# Patient Record
Sex: Female | Born: 1935
Health system: Southern US, Community
[De-identification: ages and names within clinical notes are randomized; demographics above are authoritative.]

## PROBLEM LIST (undated history)

## (undated) DIAGNOSIS — T7840XA Allergy, unspecified, initial encounter: Secondary | ICD-10-CM

## (undated) DIAGNOSIS — E079 Disorder of thyroid, unspecified: Secondary | ICD-10-CM

## (undated) DIAGNOSIS — S02609A Fracture of mandible, unspecified, initial encounter for closed fracture: Secondary | ICD-10-CM

## (undated) DIAGNOSIS — I639 Cerebral infarction, unspecified: Secondary | ICD-10-CM

## (undated) DIAGNOSIS — F32A Depression, unspecified: Secondary | ICD-10-CM

## (undated) DIAGNOSIS — K219 Gastro-esophageal reflux disease without esophagitis: Secondary | ICD-10-CM

## (undated) DIAGNOSIS — I10 Essential (primary) hypertension: Secondary | ICD-10-CM

## (undated) DIAGNOSIS — F419 Anxiety disorder, unspecified: Secondary | ICD-10-CM

## (undated) DIAGNOSIS — E785 Hyperlipidemia, unspecified: Secondary | ICD-10-CM

## (undated) DIAGNOSIS — F329 Major depressive disorder, single episode, unspecified: Secondary | ICD-10-CM

## (undated) DIAGNOSIS — K635 Polyp of colon: Secondary | ICD-10-CM

## (undated) DIAGNOSIS — K5790 Diverticulosis of intestine, part unspecified, without perforation or abscess without bleeding: Secondary | ICD-10-CM

## (undated) DIAGNOSIS — K648 Other hemorrhoids: Secondary | ICD-10-CM

## (undated) DIAGNOSIS — L814 Other melanin hyperpigmentation: Secondary | ICD-10-CM

## (undated) HISTORY — DX: Essential (primary) hypertension: I10

## (undated) HISTORY — DX: Fracture of mandible, unspecified, initial encounter for closed fracture: S02.609A

## (undated) HISTORY — DX: Diverticulosis of intestine, part unspecified, without perforation or abscess without bleeding: K57.90

## (undated) HISTORY — DX: Depression, unspecified: F32.A

## (undated) HISTORY — DX: Other hemorrhoids: K64.8

## (undated) HISTORY — DX: Polyp of colon: K63.5

## (undated) HISTORY — DX: Disorder of thyroid, unspecified: E07.9

## (undated) HISTORY — PX: CATARACT EXTRACTION: SUR2

## (undated) HISTORY — DX: Major depressive disorder, single episode, unspecified: F32.9

## (undated) HISTORY — DX: Allergy, unspecified, initial encounter: T78.40XA

## (undated) HISTORY — DX: Hyperlipidemia, unspecified: E78.5

## (undated) HISTORY — DX: Anxiety disorder, unspecified: F41.9

## (undated) HISTORY — PX: TUBAL LIGATION: SHX77

## (undated) HISTORY — PX: CHOLECYSTECTOMY: SHX55

## (undated) HISTORY — PX: TONSILLECTOMY: SHX5217

## (undated) HISTORY — DX: Cerebral infarction, unspecified: I63.9

## (undated) HISTORY — DX: Other melanin hyperpigmentation: L81.4

## (undated) HISTORY — DX: Gastro-esophageal reflux disease without esophagitis: K21.9

---

## 2002-03-02 ENCOUNTER — Other Ambulatory Visit: Admission: RE | Admit: 2002-03-02 | Discharge: 2002-03-02 | Payer: Self-pay | Admitting: Internal Medicine

## 2004-09-06 ENCOUNTER — Ambulatory Visit: Payer: Self-pay | Admitting: Internal Medicine

## 2004-10-21 HISTORY — PX: ENDOSCOPIC VEIN LASER TREATMENT: SHX1508

## 2004-12-10 ENCOUNTER — Ambulatory Visit: Payer: Self-pay | Admitting: Internal Medicine

## 2005-04-29 ENCOUNTER — Ambulatory Visit: Payer: Self-pay | Admitting: Internal Medicine

## 2005-06-10 ENCOUNTER — Ambulatory Visit: Payer: Self-pay | Admitting: Internal Medicine

## 2005-08-21 ENCOUNTER — Ambulatory Visit: Payer: Self-pay | Admitting: Family Medicine

## 2005-12-11 ENCOUNTER — Ambulatory Visit: Payer: Self-pay | Admitting: Internal Medicine

## 2006-03-25 ENCOUNTER — Ambulatory Visit: Payer: Self-pay | Admitting: Family Medicine

## 2006-05-14 ENCOUNTER — Other Ambulatory Visit: Payer: Self-pay

## 2006-05-14 ENCOUNTER — Emergency Department: Payer: Self-pay | Admitting: Emergency Medicine

## 2006-05-16 ENCOUNTER — Ambulatory Visit: Payer: Self-pay | Admitting: Internal Medicine

## 2006-05-19 ENCOUNTER — Ambulatory Visit: Payer: Self-pay | Admitting: Internal Medicine

## 2006-06-03 ENCOUNTER — Ambulatory Visit: Payer: Self-pay | Admitting: Internal Medicine

## 2006-06-12 ENCOUNTER — Ambulatory Visit: Payer: Self-pay | Admitting: Internal Medicine

## 2006-07-29 ENCOUNTER — Ambulatory Visit: Payer: Self-pay | Admitting: Internal Medicine

## 2006-08-19 ENCOUNTER — Ambulatory Visit: Payer: Self-pay | Admitting: Internal Medicine

## 2006-09-02 ENCOUNTER — Ambulatory Visit: Payer: Self-pay | Admitting: Internal Medicine

## 2006-10-07 ENCOUNTER — Ambulatory Visit: Payer: Self-pay | Admitting: Internal Medicine

## 2006-11-03 ENCOUNTER — Ambulatory Visit: Payer: Self-pay | Admitting: Internal Medicine

## 2006-12-15 ENCOUNTER — Ambulatory Visit: Payer: Self-pay | Admitting: Internal Medicine

## 2006-12-15 LAB — CONVERTED CEMR LAB
BUN: 16 mg/dL (ref 6–23)
Chloride: 100 meq/L (ref 96–112)
GFR calc Af Amer: 80 mL/min
GFR calc non Af Amer: 66 mL/min
Glucose, Bld: 106 mg/dL — ABNORMAL HIGH (ref 70–99)
Hgb A1c MFr Bld: 7.1 % — ABNORMAL HIGH (ref 4.6–6.0)
Phosphorus: 4.4 mg/dL (ref 2.3–4.6)
Potassium: 4.3 meq/L (ref 3.5–5.1)

## 2006-12-29 ENCOUNTER — Ambulatory Visit: Payer: Self-pay | Admitting: Internal Medicine

## 2007-01-29 ENCOUNTER — Ambulatory Visit: Payer: Self-pay | Admitting: Internal Medicine

## 2007-01-30 LAB — CONVERTED CEMR LAB
ALT: 16 units/L (ref 0–40)
AST: 26 units/L (ref 0–37)
Albumin: 4 g/dL (ref 3.5–5.2)
Alkaline Phosphatase: 88 units/L (ref 39–117)
Basophils Relative: 0.7 % (ref 0.0–1.0)
Calcium: 9.6 mg/dL (ref 8.4–10.5)
Creatinine, Ser: 1.3 mg/dL — ABNORMAL HIGH (ref 0.4–1.2)
Eosinophils Relative: 3.5 % (ref 0.0–5.0)
GFR calc Af Amer: 52 mL/min
HCT: 40.3 % (ref 36.0–46.0)
Hemoglobin: 13.6 g/dL (ref 12.0–15.0)
Lymphocytes Relative: 29.1 % (ref 12.0–46.0)
MCHC: 33.8 g/dL (ref 30.0–36.0)
RBC: 4.68 M/uL (ref 3.87–5.11)
RDW: 13 % (ref 11.5–14.6)
Total Bilirubin: 0.6 mg/dL (ref 0.3–1.2)
Total Protein: 7.1 g/dL (ref 6.0–8.3)
WBC: 9.6 10*3/uL (ref 4.5–10.5)

## 2007-02-03 DIAGNOSIS — I10 Essential (primary) hypertension: Secondary | ICD-10-CM

## 2007-02-03 DIAGNOSIS — E1140 Type 2 diabetes mellitus with diabetic neuropathy, unspecified: Secondary | ICD-10-CM

## 2007-02-03 DIAGNOSIS — F39 Unspecified mood [affective] disorder: Secondary | ICD-10-CM

## 2007-02-03 DIAGNOSIS — K219 Gastro-esophageal reflux disease without esophagitis: Secondary | ICD-10-CM | POA: Insufficient documentation

## 2007-02-03 DIAGNOSIS — J301 Allergic rhinitis due to pollen: Secondary | ICD-10-CM

## 2007-02-20 ENCOUNTER — Encounter: Payer: Self-pay | Admitting: Internal Medicine

## 2007-03-03 ENCOUNTER — Encounter: Payer: Self-pay | Admitting: Internal Medicine

## 2007-04-08 ENCOUNTER — Telehealth (INDEPENDENT_AMBULATORY_CARE_PROVIDER_SITE_OTHER): Payer: Self-pay | Admitting: *Deleted

## 2007-05-06 ENCOUNTER — Encounter: Payer: Self-pay | Admitting: Internal Medicine

## 2007-05-11 ENCOUNTER — Encounter (INDEPENDENT_AMBULATORY_CARE_PROVIDER_SITE_OTHER): Payer: Self-pay | Admitting: *Deleted

## 2007-05-21 ENCOUNTER — Ambulatory Visit: Payer: Self-pay | Admitting: Family Medicine

## 2007-06-03 ENCOUNTER — Ambulatory Visit: Payer: Self-pay | Admitting: Internal Medicine

## 2007-06-04 LAB — CONVERTED CEMR LAB
Albumin: 3.8 g/dL (ref 3.5–5.2)
BUN: 15 mg/dL (ref 6–23)
Calcium: 9.7 mg/dL (ref 8.4–10.5)
Chloride: 103 meq/L (ref 96–112)
GFR calc Af Amer: 71 mL/min
Glucose, Bld: 104 mg/dL — ABNORMAL HIGH (ref 70–99)
Potassium: 3.3 meq/L — ABNORMAL LOW (ref 3.5–5.1)

## 2007-06-08 ENCOUNTER — Ambulatory Visit: Payer: Self-pay | Admitting: Internal Medicine

## 2007-06-08 ENCOUNTER — Encounter: Payer: Self-pay | Admitting: Internal Medicine

## 2007-06-09 ENCOUNTER — Encounter (INDEPENDENT_AMBULATORY_CARE_PROVIDER_SITE_OTHER): Payer: Self-pay | Admitting: *Deleted

## 2007-07-03 ENCOUNTER — Ambulatory Visit: Payer: Self-pay | Admitting: Internal Medicine

## 2007-07-27 ENCOUNTER — Ambulatory Visit: Payer: Self-pay | Admitting: Internal Medicine

## 2007-07-30 ENCOUNTER — Encounter: Payer: Self-pay | Admitting: Internal Medicine

## 2007-08-15 ENCOUNTER — Emergency Department: Payer: Self-pay | Admitting: Emergency Medicine

## 2007-08-18 ENCOUNTER — Encounter: Payer: Self-pay | Admitting: Internal Medicine

## 2007-08-20 ENCOUNTER — Encounter: Admission: RE | Admit: 2007-08-20 | Discharge: 2007-08-20 | Payer: Self-pay | Admitting: Sports Medicine

## 2007-08-22 HISTORY — PX: MENISCUS REPAIR: SHX5179

## 2007-08-31 ENCOUNTER — Telehealth: Payer: Self-pay | Admitting: Internal Medicine

## 2007-09-01 ENCOUNTER — Encounter: Payer: Self-pay | Admitting: Internal Medicine

## 2007-09-01 ENCOUNTER — Observation Stay (HOSPITAL_COMMUNITY): Admission: AD | Admit: 2007-09-01 | Discharge: 2007-09-02 | Payer: Self-pay | Admitting: Orthopedic Surgery

## 2007-09-04 ENCOUNTER — Telehealth: Payer: Self-pay | Admitting: Internal Medicine

## 2007-10-29 ENCOUNTER — Encounter: Payer: Self-pay | Admitting: Internal Medicine

## 2007-10-30 ENCOUNTER — Encounter: Payer: Self-pay | Admitting: Orthopedic Surgery

## 2007-11-06 ENCOUNTER — Ambulatory Visit: Payer: Self-pay | Admitting: Internal Medicine

## 2007-11-06 LAB — CONVERTED CEMR LAB: Rapid Strep: NEGATIVE

## 2007-11-09 LAB — CONVERTED CEMR LAB
BUN: 19 mg/dL (ref 6–23)
Creatinine, Ser: 0.91 mg/dL (ref 0.40–1.20)
Glucose, Bld: 100 mg/dL — ABNORMAL HIGH (ref 70–99)
Hgb A1c MFr Bld: 6.8 % — ABNORMAL HIGH (ref 4.6–6.1)

## 2007-11-22 ENCOUNTER — Encounter: Payer: Self-pay | Admitting: Orthopedic Surgery

## 2007-12-23 ENCOUNTER — Telehealth (INDEPENDENT_AMBULATORY_CARE_PROVIDER_SITE_OTHER): Payer: Self-pay | Admitting: *Deleted

## 2008-02-19 ENCOUNTER — Ambulatory Visit: Payer: Self-pay | Admitting: Internal Medicine

## 2008-02-19 HISTORY — PX: TOTAL KNEE ARTHROPLASTY: SHX125

## 2008-02-23 ENCOUNTER — Telehealth (INDEPENDENT_AMBULATORY_CARE_PROVIDER_SITE_OTHER): Payer: Self-pay | Admitting: *Deleted

## 2008-03-11 ENCOUNTER — Inpatient Hospital Stay (HOSPITAL_COMMUNITY): Admission: RE | Admit: 2008-03-11 | Discharge: 2008-03-18 | Payer: Self-pay | Admitting: Orthopedic Surgery

## 2008-03-12 ENCOUNTER — Encounter (INDEPENDENT_AMBULATORY_CARE_PROVIDER_SITE_OTHER): Payer: Self-pay | Admitting: Neurology

## 2008-03-13 ENCOUNTER — Ambulatory Visit: Payer: Self-pay | Admitting: Internal Medicine

## 2008-03-14 ENCOUNTER — Other Ambulatory Visit: Payer: Self-pay

## 2008-03-15 ENCOUNTER — Other Ambulatory Visit: Payer: Self-pay | Admitting: Neurology

## 2008-03-15 ENCOUNTER — Ambulatory Visit: Payer: Self-pay | Admitting: Physical Medicine & Rehabilitation

## 2008-03-15 ENCOUNTER — Encounter (INDEPENDENT_AMBULATORY_CARE_PROVIDER_SITE_OTHER): Payer: Self-pay | Admitting: Neurology

## 2008-03-16 ENCOUNTER — Telehealth (INDEPENDENT_AMBULATORY_CARE_PROVIDER_SITE_OTHER): Payer: Self-pay | Admitting: *Deleted

## 2008-03-16 ENCOUNTER — Other Ambulatory Visit: Payer: Self-pay | Admitting: Neurology

## 2008-03-16 ENCOUNTER — Encounter (INDEPENDENT_AMBULATORY_CARE_PROVIDER_SITE_OTHER): Payer: Self-pay | Admitting: Neurology

## 2008-03-17 ENCOUNTER — Encounter: Payer: Self-pay | Admitting: Internal Medicine

## 2008-03-17 ENCOUNTER — Other Ambulatory Visit: Payer: Self-pay | Admitting: Neurology

## 2008-03-18 ENCOUNTER — Ambulatory Visit: Payer: Self-pay | Admitting: Vascular Surgery

## 2008-03-18 ENCOUNTER — Other Ambulatory Visit: Payer: Self-pay | Admitting: Neurology

## 2008-03-18 ENCOUNTER — Encounter (INDEPENDENT_AMBULATORY_CARE_PROVIDER_SITE_OTHER): Payer: Self-pay | Admitting: Orthopedic Surgery

## 2008-03-18 ENCOUNTER — Inpatient Hospital Stay (HOSPITAL_COMMUNITY)
Admission: RE | Admit: 2008-03-18 | Discharge: 2008-03-24 | Payer: Self-pay | Admitting: Physical Medicine & Rehabilitation

## 2008-03-18 ENCOUNTER — Encounter: Payer: Self-pay | Admitting: Internal Medicine

## 2008-04-05 ENCOUNTER — Ambulatory Visit: Payer: Self-pay | Admitting: Internal Medicine

## 2008-04-05 DIAGNOSIS — I6990 Unspecified sequelae of unspecified cerebrovascular disease: Secondary | ICD-10-CM

## 2008-04-05 DIAGNOSIS — Q211 Atrial septal defect: Secondary | ICD-10-CM

## 2008-04-25 ENCOUNTER — Telehealth (INDEPENDENT_AMBULATORY_CARE_PROVIDER_SITE_OTHER): Payer: Self-pay | Admitting: *Deleted

## 2008-05-23 ENCOUNTER — Encounter: Payer: Self-pay | Admitting: Internal Medicine

## 2008-06-09 ENCOUNTER — Ambulatory Visit: Payer: Self-pay | Admitting: Internal Medicine

## 2008-06-15 ENCOUNTER — Ambulatory Visit: Payer: Self-pay | Admitting: Internal Medicine

## 2008-06-16 LAB — CONVERTED CEMR LAB
ALT: 12 units/L (ref 0–35)
AST: 16 units/L (ref 0–37)
Basophils Absolute: 0.1 10*3/uL (ref 0.0–0.1)
Bilirubin, Direct: 0.1 mg/dL (ref 0.0–0.3)
Chloride: 108 meq/L (ref 96–112)
Cholesterol: 249 mg/dL (ref 0–200)
Creatinine, Ser: 1 mg/dL (ref 0.4–1.2)
Direct LDL: 140.6 mg/dL
Glucose, Bld: 142 mg/dL — ABNORMAL HIGH (ref 70–99)
HCT: 40.8 % (ref 36.0–46.0)
HDL: 53.5 mg/dL (ref >39.0)
MCHC: 34.3 g/dL (ref 30.0–36.0)
MCV: 84.1 fL (ref 78.0–100.0)
Neutro Abs: 6 10*3/uL (ref 1.4–7.7)
Neutrophils Relative %: 62.5 % (ref 43.0–77.0)
Phosphorus: 4.5 mg/dL (ref 2.3–4.6)
Platelets: 367 10*3/uL (ref 150–400)
Potassium: 4.5 meq/L (ref 3.5–5.1)
Sodium: 144 meq/L (ref 135–145)
VLDL: 28 mg/dL (ref 0–40)
WBC: 9.6 10*3/uL (ref 4.5–10.5)

## 2008-06-23 ENCOUNTER — Telehealth: Payer: Self-pay | Admitting: Internal Medicine

## 2008-07-21 ENCOUNTER — Ambulatory Visit: Payer: Self-pay | Admitting: Internal Medicine

## 2008-07-29 ENCOUNTER — Encounter: Payer: Self-pay | Admitting: Internal Medicine

## 2008-09-05 ENCOUNTER — Telehealth (INDEPENDENT_AMBULATORY_CARE_PROVIDER_SITE_OTHER): Payer: Self-pay | Admitting: *Deleted

## 2008-09-06 ENCOUNTER — Telehealth: Payer: Self-pay | Admitting: Internal Medicine

## 2008-09-14 ENCOUNTER — Encounter: Payer: Self-pay | Admitting: Internal Medicine

## 2008-09-14 ENCOUNTER — Ambulatory Visit: Payer: Self-pay | Admitting: Cardiology

## 2008-09-14 LAB — CONVERTED CEMR LAB
ALT: 10 units/L (ref 0–35)
Albumin: 4.6 g/dL (ref 3.5–5.2)
Indirect Bilirubin: 0.5 mg/dL (ref 0.0–0.9)

## 2008-09-22 ENCOUNTER — Ambulatory Visit: Payer: Self-pay | Admitting: Internal Medicine

## 2008-10-17 ENCOUNTER — Telehealth: Payer: Self-pay | Admitting: Internal Medicine

## 2008-10-24 ENCOUNTER — Ambulatory Visit: Payer: Self-pay | Admitting: Internal Medicine

## 2008-11-04 ENCOUNTER — Telehealth: Payer: Self-pay | Admitting: Internal Medicine

## 2008-11-30 ENCOUNTER — Telehealth: Payer: Self-pay | Admitting: Internal Medicine

## 2008-12-22 ENCOUNTER — Telehealth: Payer: Self-pay | Admitting: Internal Medicine

## 2008-12-29 ENCOUNTER — Ambulatory Visit: Payer: Self-pay | Admitting: Family Medicine

## 2008-12-29 LAB — CONVERTED CEMR LAB
Bacteria, UA: 0
Glucose, Urine, Semiquant: NEGATIVE
Ketones, urine, test strip: NEGATIVE
Nitrite: NEGATIVE
Specific Gravity, Urine: 1.015

## 2008-12-30 ENCOUNTER — Telehealth: Payer: Self-pay | Admitting: Family Medicine

## 2009-01-09 ENCOUNTER — Encounter: Payer: Self-pay | Admitting: Cardiovascular Disease

## 2009-01-09 ENCOUNTER — Ambulatory Visit: Payer: Self-pay | Admitting: Internal Medicine

## 2009-01-09 LAB — CONVERTED CEMR LAB
ALT: 11 units/L (ref 0–35)
Albumin: 4.4 g/dL (ref 3.5–5.2)
Bilirubin, Direct: 0.1 mg/dL (ref 0.0–0.3)
Indirect Bilirubin: 0.2 mg/dL (ref 0.0–0.9)

## 2009-01-19 ENCOUNTER — Ambulatory Visit: Payer: Self-pay | Admitting: Internal Medicine

## 2009-01-19 DIAGNOSIS — G479 Sleep disorder, unspecified: Secondary | ICD-10-CM | POA: Insufficient documentation

## 2009-01-23 LAB — CONVERTED CEMR LAB
HCT: 45.7 % (ref 36.0–46.0)
Lymphocytes Relative: 40 % (ref 12–46)
Lymphs Abs: 4 10*3/uL (ref 0.7–4.0)
MCHC: 31.7 g/dL (ref 30.0–36.0)
MCV: 88.1 fL (ref 78.0–100.0)
Monocytes Absolute: 0.9 10*3/uL (ref 0.1–1.0)
Monocytes Relative: 9 % (ref 3–12)
Platelets: 370 10*3/uL (ref 150–400)
Potassium: 4.1 meq/L (ref 3.5–5.3)
Sodium: 140 meq/L (ref 135–145)
TSH: 3.809 microintl units/mL (ref 0.350–4.500)

## 2009-02-02 ENCOUNTER — Ambulatory Visit: Payer: Self-pay | Admitting: Internal Medicine

## 2009-02-02 ENCOUNTER — Encounter: Payer: Self-pay | Admitting: Internal Medicine

## 2009-02-03 ENCOUNTER — Encounter: Payer: Self-pay | Admitting: Internal Medicine

## 2009-03-01 ENCOUNTER — Telehealth: Payer: Self-pay | Admitting: Internal Medicine

## 2009-04-11 ENCOUNTER — Telehealth: Payer: Self-pay | Admitting: Cardiovascular Disease

## 2009-04-13 ENCOUNTER — Ambulatory Visit: Payer: Self-pay | Admitting: Internal Medicine

## 2009-05-15 ENCOUNTER — Ambulatory Visit: Payer: Self-pay | Admitting: Family Medicine

## 2009-05-15 LAB — CONVERTED CEMR LAB
Casts: 0 /lpf
Urine crystals, microscopic: 0 /hpf

## 2009-05-16 ENCOUNTER — Encounter: Payer: Self-pay | Admitting: Family Medicine

## 2009-05-19 ENCOUNTER — Telehealth: Payer: Self-pay | Admitting: Family Medicine

## 2009-06-02 ENCOUNTER — Ambulatory Visit: Payer: Self-pay | Admitting: Internal Medicine

## 2009-06-02 ENCOUNTER — Telehealth: Payer: Self-pay | Admitting: Internal Medicine

## 2009-06-02 LAB — CONVERTED CEMR LAB
Bacteria, UA: NEGATIVE
Bilirubin Urine: NEGATIVE
Glucose, Urine, Semiquant: NEGATIVE
Ketones, urine, test strip: NEGATIVE
Protein, U semiquant: NEGATIVE
Urobilinogen, UA: 0.2

## 2009-06-05 LAB — CONVERTED CEMR LAB
ALT: 16 units/L (ref 0–35)
AST: 24 units/L (ref 0–37)
Alkaline Phosphatase: 98 units/L (ref 39–117)
Basophils Absolute: 0 10*3/uL (ref 0.0–0.1)
Basophils Relative: 0 % (ref 0.0–3.0)
CO2: 28 meq/L (ref 19–32)
Calcium: 9.8 mg/dL (ref 8.4–10.5)
Chloride: 101 meq/L (ref 96–112)
Eosinophils Absolute: 0.4 10*3/uL (ref 0.0–0.7)
HCT: 40.9 % (ref 36.0–46.0)
Hgb A1c MFr Bld: 7.4 % — ABNORMAL HIGH (ref 4.6–6.5)
MCV: 88.8 fL (ref 78.0–100.0)
Monocytes Absolute: 0.8 10*3/uL (ref 0.1–1.0)
Monocytes Relative: 9.3 % (ref 3.0–12.0)
Neutro Abs: 4.5 10*3/uL (ref 1.4–7.7)
RDW: 14 % (ref 11.5–14.6)
Sodium: 138 meq/L (ref 135–145)
Total Protein: 7.1 g/dL (ref 6.0–8.3)

## 2009-06-19 ENCOUNTER — Ambulatory Visit: Payer: Self-pay | Admitting: Internal Medicine

## 2009-06-19 DIAGNOSIS — E039 Hypothyroidism, unspecified: Secondary | ICD-10-CM | POA: Insufficient documentation

## 2009-07-26 ENCOUNTER — Encounter: Payer: Self-pay | Admitting: Internal Medicine

## 2009-07-26 ENCOUNTER — Ambulatory Visit: Payer: Self-pay | Admitting: Ophthalmology

## 2009-07-28 ENCOUNTER — Encounter: Payer: Self-pay | Admitting: Internal Medicine

## 2009-07-28 ENCOUNTER — Telehealth: Payer: Self-pay | Admitting: Internal Medicine

## 2009-08-01 ENCOUNTER — Telehealth: Payer: Self-pay | Admitting: Family Medicine

## 2009-08-07 ENCOUNTER — Ambulatory Visit: Payer: Self-pay | Admitting: Ophthalmology

## 2009-09-27 ENCOUNTER — Telehealth: Payer: Self-pay | Admitting: Internal Medicine

## 2009-10-02 ENCOUNTER — Ambulatory Visit: Payer: Self-pay | Admitting: Internal Medicine

## 2009-10-06 LAB — CONVERTED CEMR LAB
ALT: 18 units/L (ref 0–35)
AST: 24 units/L (ref 0–37)
Albumin: 4.2 g/dL (ref 3.5–5.2)
Alkaline Phosphatase: 89 units/L (ref 39–117)
Basophils Absolute: 0 10*3/uL (ref 0.0–0.1)
Basophils Relative: 0.4 % (ref 0.0–3.0)
CO2: 28 meq/L (ref 19–32)
Calcium: 9.8 mg/dL (ref 8.4–10.5)
Eosinophils Relative: 4.3 % (ref 0.0–5.0)
GFR calc non Af Amer: 65.58 mL/min (ref >60)
Glucose, Bld: 111 mg/dL — ABNORMAL HIGH (ref 70–99)
Hemoglobin: 14 g/dL (ref 12.0–15.0)
Neutrophils Relative %: 62.8 % (ref 43.0–77.0)
Phosphorus: 3.6 mg/dL (ref 2.3–4.6)
Platelets: 337 10*3/uL (ref 150.0–400.0)
RBC: 4.74 M/uL (ref 3.87–5.11)
Sodium: 140 meq/L (ref 135–145)
TSH: 2.68 microintl units/mL (ref 0.35–5.50)
Total Protein: 7.2 g/dL (ref 6.0–8.3)

## 2009-11-13 ENCOUNTER — Telehealth: Payer: Self-pay | Admitting: Internal Medicine

## 2009-11-21 ENCOUNTER — Telehealth: Payer: Self-pay | Admitting: Internal Medicine

## 2010-01-09 ENCOUNTER — Telehealth: Payer: Self-pay | Admitting: Internal Medicine

## 2010-01-18 ENCOUNTER — Telehealth: Payer: Self-pay | Admitting: Internal Medicine

## 2010-03-08 ENCOUNTER — Encounter: Payer: Self-pay | Admitting: Internal Medicine

## 2010-03-08 ENCOUNTER — Ambulatory Visit: Payer: Self-pay | Admitting: Internal Medicine

## 2010-03-09 ENCOUNTER — Encounter: Payer: Self-pay | Admitting: Internal Medicine

## 2010-03-14 ENCOUNTER — Ambulatory Visit: Payer: Self-pay | Admitting: Internal Medicine

## 2010-03-14 DIAGNOSIS — E785 Hyperlipidemia, unspecified: Secondary | ICD-10-CM

## 2010-03-16 ENCOUNTER — Encounter (INDEPENDENT_AMBULATORY_CARE_PROVIDER_SITE_OTHER): Payer: Self-pay | Admitting: *Deleted

## 2010-03-16 LAB — CONVERTED CEMR LAB
LDL Cholesterol: 45 mg/dL (ref 0–99)
Total Bilirubin: 0.3 mg/dL (ref 0.3–1.2)

## 2010-03-20 ENCOUNTER — Telehealth: Payer: Self-pay | Admitting: Internal Medicine

## 2010-04-12 ENCOUNTER — Telehealth: Payer: Self-pay | Admitting: Internal Medicine

## 2010-05-18 ENCOUNTER — Telehealth: Payer: Self-pay | Admitting: Internal Medicine

## 2010-06-05 ENCOUNTER — Ambulatory Visit: Payer: Self-pay | Admitting: Internal Medicine

## 2010-06-18 ENCOUNTER — Ambulatory Visit: Payer: Self-pay | Admitting: Internal Medicine

## 2010-07-02 ENCOUNTER — Telehealth (INDEPENDENT_AMBULATORY_CARE_PROVIDER_SITE_OTHER): Payer: Self-pay | Admitting: *Deleted

## 2010-07-03 ENCOUNTER — Encounter: Payer: Self-pay | Admitting: Cardiology

## 2010-07-03 ENCOUNTER — Encounter (HOSPITAL_COMMUNITY): Admission: RE | Admit: 2010-07-03 | Discharge: 2010-08-22 | Payer: Self-pay | Admitting: Internal Medicine

## 2010-07-03 ENCOUNTER — Ambulatory Visit: Payer: Self-pay | Admitting: Cardiology

## 2010-07-03 ENCOUNTER — Ambulatory Visit: Payer: Self-pay

## 2010-07-04 ENCOUNTER — Encounter: Payer: Self-pay | Admitting: Cardiology

## 2010-07-17 ENCOUNTER — Ambulatory Visit: Payer: Self-pay | Admitting: Internal Medicine

## 2010-07-18 ENCOUNTER — Telehealth: Payer: Self-pay | Admitting: Internal Medicine

## 2010-07-18 ENCOUNTER — Encounter: Payer: Self-pay | Admitting: Internal Medicine

## 2010-07-18 ENCOUNTER — Ambulatory Visit (HOSPITAL_BASED_OUTPATIENT_CLINIC_OR_DEPARTMENT_OTHER): Admission: RE | Admit: 2010-07-18 | Discharge: 2010-07-18 | Payer: Self-pay | Admitting: Internal Medicine

## 2010-07-25 ENCOUNTER — Ambulatory Visit: Payer: Self-pay | Admitting: Internal Medicine

## 2010-07-26 ENCOUNTER — Encounter: Payer: Self-pay | Admitting: Internal Medicine

## 2010-07-31 ENCOUNTER — Ambulatory Visit: Payer: Self-pay | Admitting: Pulmonary Disease

## 2010-09-10 ENCOUNTER — Ambulatory Visit: Payer: Self-pay | Admitting: Ophthalmology

## 2010-09-17 ENCOUNTER — Telehealth: Payer: Self-pay | Admitting: Internal Medicine

## 2010-09-17 ENCOUNTER — Ambulatory Visit: Payer: Self-pay | Admitting: Internal Medicine

## 2010-09-18 LAB — CONVERTED CEMR LAB: Hgb A1c MFr Bld: 7.4 % — ABNORMAL HIGH (ref 4.6–6.5)

## 2010-09-19 ENCOUNTER — Telehealth: Payer: Self-pay | Admitting: Internal Medicine

## 2010-11-18 LAB — CONVERTED CEMR LAB
Basophils Absolute: 0.1 10*3/uL (ref 0.0–0.1)
Basophils Absolute: 0.1 10*3/uL (ref 0.0–0.1)
Basophils Relative: 1.2 % — ABNORMAL HIGH (ref 0.0–1.0)
Chloride: 102 meq/L (ref 96–112)
Creatinine, Ser: 0.8 mg/dL (ref 0.4–1.2)
Eosinophils Absolute: 0.3 10*3/uL (ref 0.0–0.7)
Eosinophils Relative: 2.9 % (ref 0.0–5.0)
Glucose, Bld: 128 mg/dL — ABNORMAL HIGH (ref 70–99)
HCT: 40.9 % (ref 36.0–46.0)
Hemoglobin: 13.1 g/dL (ref 12.0–15.0)
Hemoglobin: 13.9 g/dL (ref 12.0–15.0)
Hgb A1c MFr Bld: 7.7 % — ABNORMAL HIGH (ref 4.6–6.0)
Lymphocytes Relative: 33.1 % (ref 12.0–46.0)
Lymphs Abs: 3.1 10*3/uL (ref 0.7–4.0)
MCV: 88.6 fL (ref 78.0–100.0)
Monocytes Relative: 8.3 % (ref 3.0–12.0)
Neutro Abs: 4.6 10*3/uL (ref 1.4–7.7)
Neutro Abs: 5.1 10*3/uL (ref 1.4–7.7)
Neutrophils Relative %: 51.4 % (ref 43.0–77.0)
Platelets: 330 10*3/uL (ref 150.0–400.0)
Potassium: 3.8 meq/L (ref 3.5–5.1)
RBC: 4.62 M/uL (ref 3.87–5.11)
RDW: 13.3 % (ref 11.5–14.6)
RDW: 14.8 % — ABNORMAL HIGH (ref 11.5–14.6)
Sodium: 139 meq/L (ref 135–145)
TSH: 2.02 microintl units/mL (ref 0.35–5.50)
TSH: 4.03 microintl units/mL (ref 0.35–5.50)
WBC: 9 10*3/uL (ref 4.5–10.5)

## 2010-11-20 NOTE — Assessment & Plan Note (Signed)
Summary: Cardiology Nuclear Testing  Nuclear Med Background Indications for Stress Test: Evaluation for Ischemia   History: Echo   Symptoms: Diaphoresis, DOE, Fatigue, Light-Headedness, Palpitations, Rapid HR    Nuclear Pre-Procedure Cardiac Risk Factors: CVA, Family History - CAD, History of Smoking, Hypertension, Lipids, NIDDM Caffeine/Decaff Intake: None NPO After: 11:00 PM Lungs: Clear IV 0.9% NS with Angio Cath: 22g     IV Site: R Hand IV Started by: Irean Hong, RN Chest Size (in) 42     Cup Size C     Height (in): 65 Weight (lb): 214 BMI: 35.74  Nuclear Med Study 1 or 2 day study:  1 day     Stress Test Type:  Lexiscan low level Reading MD:  Marca Ancona, MD     Referring MD:  P.Ross Resting Radionuclide:  Technetium 41m Tetrofosmin     Resting Radionuclide Dose:  11 mCi  Stress Radionuclide:  Technetium 81m Tetrofosmin     Stress Radionuclide Dose:  33 mCi   Stress Protocol      Max HR:  139 bpm     Predicted Max HR:  149 bpm  Max Systolic BP: 192 mm Hg     Percent Max HR:  93.29 %Rate Pressure Product:  03474  Lexiscan: 0.4 mg   Stress Test Technologist:  Irean Hong,  RN     Nuclear Technologist:  Doyne Keel, CNMT  Rest Procedure  Myocardial perfusion imaging was performed at rest 45 minutes following the intravenous administration of Technetium 35m Tetrofosmin.  Stress Procedure  The patient received IV Lexiscan 0.4 mg over 15-seconds with concurrent low level exercise and then  Technetium 72m Tetrofosmin injected at 30-seconds while the patient continued walking one more minute.  There were no significant changes with lexiscan, occ PVC's.  Quantitative spect images were obtained after a 45 minute delay.  QPS Raw Data Images:  Normal; no motion artifact; normal heart/lung ratio. Stress Images:  Normal homogeneous uptake in all areas of the myocardium. Rest Images:  Normal homogeneous uptake in all areas of the myocardium. Subtraction (SDS):  There is  no evidence of scar or ischemia. Transient Ischemic Dilatation:  0.92  (Normal <1.22)  Lung/Heart Ratio:  0.34  (Normal <0.45)  Quantitative Gated Spect Images QGS EDV:  60 ml QGS ESV:  15 ml QGS EF:  76 % QGS cine images:  Normal wall motion.    Overall Impression  Exercise Capacity: Lexiscan with no exercise. BP Response: Hypertensive blood pressure response. Clinical Symptoms: No shortness of breath.  ECG Impression: No significant ST segment change suggestive of ischemia. Overall Impression: Normal stress nuclear study.  Appended Document: Cardiology Nuclear Testing Normal stress myoview.  Appended Document: Cardiology Nuclear Testing Patient aware of results.

## 2010-11-20 NOTE — Progress Notes (Signed)
Summary: refill request for clorazepate  Phone Note Refill Request Message from:  Fax from Pharmacy  Refills Requested: Medication #1:  CLORAZEPATE DIPOTASSIUM 7.5 MG TABS Take 1 tablet by mouth three times a day as needed for anxiety   Last Refilled: 04/18/2010 Faxed request from [facility] [employer] is on your desk.  Initial call taken by: Lowella Petties CMA,  May 18, 2010 9:08 AM  Follow-up for Phone Call        okay #90 x 1 Follow-up by: Cindee Salt MD,  May 18, 2010 1:36 PM  Additional Follow-up for Phone Call Additional follow up Details #1::        Rx called to pharmacy Additional Follow-up by: Janee Morn CMA,  May 18, 2010 2:26 PM    Prescriptions: CLORAZEPATE DIPOTASSIUM 7.5 MG TABS (CLORAZEPATE DIPOTASSIUM) Take 1 tablet by mouth three times a day as needed for anxiety  #90 x 1   Entered by:   Janee Morn CMA   Authorized by:   Cindee Salt MD   Signed by:   Janee Morn CMA on 05/18/2010   Method used:   Telephoned to ...       [id number] ([facility]       [address]       [place], [place]  [address]       Ph: [phone] or [phone]       Fax: [phone]   RxID:   [id number]

## 2010-11-20 NOTE — Miscellaneous (Signed)
Summary: Appointment Canceled  Appointment status changed to canceled by [employer] on 06/26/2010 12:39 PM.  Cancellation Comments --------------------- [facility] [address]EVERCARE/MEDICAID/SL  Appointment Information ----------------------- Appt Type:  CARDIOLOGY NUCLEAR TESTING      Date:  Wednesday, June 27, 2010      Time:  9:30 AM for 15 min   Urgency:  Routine   Made By:  Pearson Grippe  To Visit:  [id number]    Reason:  [facility] [address]EVERCARE/MEDICAID/SL  Appt Comments ------------- -- 06/26/10 12:39: (CEMR) CANCELED -- [facility] [address]EVERCARE/MEDICAID/SL -- 06/22/10 9:58: (CEMR) BOOKED -- Routine CARDIOLOGY NUCLEAR TESTING at 06/27/2010 9:30 AM for 15 min [facility] [address]EVERCARE/MEDICAID/SL -- 06/18/10 13:09: (CEMR) BOOK

## 2010-11-20 NOTE — Progress Notes (Signed)
Summary: needs scripts faxed  Phone Note Call from Patient   Caller: Patient Call For: Cindee Salt MD Summary of Call: Pt was seen on monday and was told scripts would be sent to [facility] electronically.  Pharmacy told her they didnt get these scripts but also told her that because she is medicare and the scripts are new ones they have to have written scripts faxed to them with dx and number of times to test daily.  Fax is [phone]. Initial call taken by: Lowella Petties CMA, AAMA,  September 19, 2010 8:36 AM  Follow-up for Phone Call        new scripts faxed to [facility], Spoke with patient and advised results.  Follow-up by: Mervin Hack CMA Duncan Dull),  September 19, 2010 10:10 AM    Prescriptions: BLOOD GLUCOSE TEST  STRP (GLUCOSE BLOOD) patient tests once daily dx: 250.00  #79mth supply x 12   Entered by:   Mervin Hack CMA (AAMA)   Authorized by:   Cindee Salt MD   Signed by:   Mervin Hack CMA (AAMA) on 09/19/2010   Method used:   Printed then faxed to ...       [facility]* (retail)       [address] [place], [place]  [address]       Ph: [phone]       Fax: [phone]   RxID:   [id number] BLOOD GLUCOSE METER  KIT (BLOOD GLUCOSE MONITORING SUPPL) patient tests once daily dx:250.00  #1 x 0   Entered by:   Mervin Hack CMA (AAMA)   Authorized by:   Cindee Salt MD   Signed by:   Mervin Hack CMA (AAMA) on 09/19/2010   Method used:   Printed then faxed to ...       [facility]* (retail)       [address] [place], [place]  [address]       Ph: [phone]       Fax: [phone]   RxID:   [id number]   Appended Document: needs scripts faxed Pt says we had the wrong pharmacy name, [facility], scripts faxed.

## 2010-11-20 NOTE — Assessment & Plan Note (Signed)
Summary: COUGH,CONGESTION,WATERY EYES/CLE   Vital Signs:  Patient profile:   75 year old female Weight:      212 pounds O2 Sat:      96 % on Room air Temp:     97.8 degrees F tympanic Pulse rate:   100 / minute Pulse rhythm:   regular BP sitting:   122 / 88  (left arm) Cuff size:   large  Vitals Entered By: Mervin Hack CMA Duncan Dull) (June 05, 2010 12:37 PM)  O2 Flow:  Room air CC: cough, allergies   History of Present Illness: Having respiratory illness since last week Lots of sneezing and coughing Throat is sore and she is hoarse Cough is productive of grayish sputum---seems to be coming from PND Some ear pain  No clear cut fever some sweats  no chills or shakes  Took allegra and benzonatate Astelin also  Allergies: 1)  ! Wellbutrin (Bupropion Hcl) 2)  Codeine Phosphate (Codeine Phosphate) 3)  * Xanax 4)  * Serzone 5)  Celexa 6)  Prozac (Fluoxetine Hcl) 7)  Norvasc (Amlodipine Besylate) 8)  * Ace  Past History:  Past medical, surgical, family and social histories (including risk factors) reviewed for relevance to current acute and chronic problems.  Past Medical History: Reviewed history from 03/14/2010 and no changes required. Allergic rhinitis Anxiety Depression Diabetes mellitus, type II GERD Hypertension CVA--5/09 Hypothyroidism Hyperlipidemia  Past Surgical History: Reviewed history from 10/02/2009 and no changes required. Cholecystectomy--1950's Tonsillectomy VD x5 Tubal ligation in 1960's Vein treatment--laser/injections    2006 Rgiht knee meniscus repair-Dr Ranell Patrick  11/08 TKR 5/09--CVA after Ranell Patrick) Cataract left eye----------Dr Dingledein  Family History: Reviewed history from 02/03/2007 and no changes required. Mom had CAD and vascular disease  Social History: Reviewed history from 11/06/2007 and no changes required. 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  NAD Head:  no frontal tenderness Moderate right maxillary tenderness, mild on left Ears:  R ear normal and L ear normal.   Nose:  moderate congestion Thick, opaque discharge Mouth:  no erythema, no exudates, and no lesions.   Neck:  supple, no masses, and no cervical lymphadenopathy.   Lungs:  normal respiratory effort, no intercostal retractions, no accessory muscle use, normal breath sounds, no crackles, and no wheezes.     Impression & Recommendations:  Problem # 1:  SINUSITIS - ACUTE-NOS (ICD-461.9) Assessment New  not clear if she has viral or early bacterial infeciton discussed supportive care if worsens, will start amoxil  Her updated medication list for this problem includes:    Amoxicillin 500 Mg Tabs (Amoxicillin) .Marland Kitchen... 2 tabs by mouth two times a day for sinus infection  Complete Medication List: 1)  Zocor 80 Mg Tabs (Simvastatin) .Marland Kitchen.. 1  tab by mouth at bedtime 2)  Levothyroxine Sodium 25 Mcg Tabs (Levothyroxine sodium) .... Take 1 by mouth once daily 3)  Clorazepate Dipotassium 7.5 Mg Tabs (Clorazepate dipotassium) .... Take 1 tablet by mouth three times a day as needed for anxiety 4)  Potassium Chloride Crys Cr 20 Meq Tbcr (Potassium chloride crys cr) .... Take 2 tablets by mouth daily 5)  Triamterene-hctz 37.5-25 Mg Tabs (Triamterene-hctz) .... Take 1 tablet by mouth once a day 6)  Metformin Hcl 500 Mg Tabs (Metformin  hcl) .... Once tab two times a day 7)  Aspirin 325 Mg Tabs (Aspirin) .... Take one tablet by mouth daily 8)  Allegra 180 Mg Tabs (Fexofenadine hcl) .... Take one by mouth daily as needed 9)  Flexeril 5 Mg Tabs (Cyclobenzaprine hcl) .... Take 1 tab as needed muscle spasm 10)  Vitamin B-12 500 Mcg Tabs (Cyanocobalamin) .... Take 1 by mouth once daily 11)  Daily Vitamins Tabs  (Multiple vitamin) .... Vitamin d-3 take 1 by mouth once daily 12)  Amoxicillin 500 Mg Tabs (Amoxicillin) .... 2 tabs by mouth two times a day for sinus infection  Patient Instructions: 1)  Please schedule a follow-up appointment as needed .  2)  Please keep regular follow up Prescriptions: AMOXICILLIN 500 MG TABS (AMOXICILLIN) 2 tabs by mouth two times a day for sinus infection  #40 x 0   Entered and Authorized by:   Cindee Salt MD   Signed by:   Cindee Salt MD on 06/05/2010   Method used:   Print then Give to Patient   RxID:   [id number]   Current Allergies (reviewed today): ! WELLBUTRIN (BUPROPION HCL) CODEINE PHOSPHATE (CODEINE PHOSPHATE) * XANAX * SERZONE CELEXA PROZAC (FLUOXETINE HCL) NORVASC (AMLODIPINE BESYLATE) * ACE

## 2010-11-20 NOTE — Assessment & Plan Note (Signed)
Summary: 8 M F/U DLO   Vital Signs:  Patient profile:   75 year old female Weight:      212 pounds BMI:     35.41 Temp:     97.3 degrees F oral Pulse rate:   64 / minute Pulse rhythm:   regular BP sitting:   120 / 80  (left arm) Cuff size:   large  Vitals Entered By: Mervin Hack CMA Duncan Dull) (Mar 14, 2010 11:04 AM) CC: 6 month follow-up   History of Present Illness: Doing fairly well  Still has trouble using left hand properly Right hand is her dominant hand but is affected just buttoning clothes, etc  Still mows yard keeps busy with various things sits with woman once a week Hopes to get back to working again but not sure what  has episodic depression 4 deaths in past month or so--this has been hard got bad GI bug when went to beach for wedding has chills at night about once a month--may be related to overexerting when she drives herself to beach  Sugars have been very variable as high as 220 but not fasting doesn't check fasting 1-2 weekly she checks Occ mild hypoglycemic reactions---vague report though  No chest pain No SOB Does try to walk regularly  Allergies: 1)  ! Wellbutrin (Bupropion Hcl) 2)  Codeine Phosphate (Codeine Phosphate) 3)  * Xanax 4)  * Serzone 5)  Celexa 6)  Prozac (Fluoxetine Hcl) 7)  Norvasc (Amlodipine Besylate) 8)  * Ace  Past History:  Past medical, surgical, family and social histories (including risk factors) reviewed for relevance to current acute and chronic problems.  Past Medical History: Allergic rhinitis Anxiety Depression Diabetes mellitus, type II GERD Hypertension CVA--5/09 Hypothyroidism Hyperlipidemia  Past Surgical History: Reviewed history from 10/02/2009 and no changes required. Cholecystectomy--1950's Tonsillectomy VD x5 Tubal ligation in 1960's Vein treatment--laser/injections    2006 Rgiht knee meniscus repair-Dr Ranell Patrick  11/08 TKR 5/09--CVA after Ranell Patrick) Cataract left eye----------Dr  Dingledein  Family History: Reviewed history from 02/03/2007 and no changes required. Mom had CAD and vascular disease  Social History: Reviewed history from 11/06/2007 and no changes required. 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   Neck:  supple, no masses, no thyromegaly, no carotid bruits, and no cervical lymphadenopathy.   Lungs:  normal respiratory effort and normal breath sounds.   Heart:  normal rate, regular rhythm, no murmur, and no gallop.   Abdomen:  soft and non-tender.   Pulses:  1+ on right, faint on left Extremities:  no edema Skin:  no suspicious lesions and no ulcerations.   Psych:  normally interactive, good eye contact, not anxious appearing, and dysphoric affect.    Diabetes Management Exam:    Foot Exam (with socks and/or shoes not present):       Sensory-Pinprick/Light touch:          Left medial foot (L-4): diminished          Left dorsal foot (L-5): diminished          Left lateral foot (S-1): diminished          Right medial foot (L-4): normal  Right dorsal foot (L-5): normal          Right lateral foot (S-1): normal       Inspection:          Left foot: normal          Right foot: normal       Nails:          Left foot: normal          Right foot: normal   Impression & Recommendations:  Problem # 1:  DIABETES MELLITUS, TYPE II (ICD-250.00) Assessment Unchanged  still seems to have good control will check labs  Her updated medication list for this problem includes:    Metformin Hcl 500 Mg Tabs (Metformin hcl) ..... Once tab two times a day    Aspirin 325 Mg Tabs (Aspirin) .Marland Kitchen... Take one tablet by mouth daily  Labs Reviewed: Creat:  0.9 (10/02/2009)     Last Eye Exam: no retinopathy (08/07/2009) Reviewed HgBA1c results: 7.4 (10/02/2009)  7.4 (06/02/2009)  Orders: TLB-A1C / Hgb A1C (Glycohemoglobin) (83036-A1C) Venipuncture ([address])  Problem # 2:  CEREBROVASCULAR ACCIDENT, LATE EFFECTS (ICD-438.9) Assessment: Unchanged still with left hand problems on statin also  Her updated medication list for this problem includes:    Aspirin 325 Mg Tabs (Aspirin) .Marland Kitchen... Take one tablet by mouth daily  Problem # 3:  HYPERTENSION (ICD-401.9) Assessment: Unchanged good control no changes needed  Her updated medication list for this problem includes:    Triamterene-hctz 37.5-25 Mg Tabs (Triamterene-hctz) .Marland Kitchen... Take 1 tablet by mouth once a day  BP today: 120/80 Prior BP: 140/80 (10/02/2009)  Labs Reviewed: K+: 4.2 (10/02/2009) Creat: : 0.9 (10/02/2009)   Chol: 249 (06/15/2008)   HDL: 53.5 (06/15/2008)   LDL: DEL (06/15/2008)   TG: 139 (06/15/2008)  Problem # 4:  DEPRESSION (ICD-311) Assessment: Deteriorated mild worsening with all the deaths in her circle of acquaintances  The following medications were removed from the medication list:    Trazodone Hcl 50 Mg Tabs (Trazodone hcl) .Marland Kitchen... 1-2 at bedtime to help sleep Her updated medication list for this problem includes:    Clorazepate Dipotassium 7.5 Mg Tabs (Clorazepate dipotassium) .Marland Kitchen... Take 1 tablet by mouth three times a day  Problem # 5:  HYPOTHYROIDISM (ICD-244.9) Assessment: Unchanged no changes needed  Her updated medication list for this problem includes:    Levothyroxine Sodium 25 Mcg Tabs (Levothyroxine sodium) .Marland Kitchen... Take 1 by mouth once daily  Labs Reviewed: TSH: 2.68 (10/02/2009)    HgBA1c: 7.4 (10/02/2009) Chol: 249 (06/15/2008)   HDL: 53.5 (06/15/2008)   LDL: DEL (06/15/2008)   TG: 139 (06/15/2008)  Problem # 6:  GERD (ICD-530.81) Assessment: Unchanged doing okay  Her updated medication list for this problem includes:    Prilosec Otc 20 Mg  Tbec (Omeprazole magnesium) .Marland Kitchen... Take 1 tablet by mouth once daily  Complete Medication List: 1)  Zocor 80 Mg Tabs (Simvastatin) .... Take on tab by mouth at bedtime 2)  Levothyroxine Sodium 25 Mcg Tabs (Levothyroxine sodium) .... Take 1 by mouth once daily 3)  Clorazepate Dipotassium 7.5 Mg Tabs (Clorazepate dipotassium) .... Take 1 tablet by mouth three times a day 4)  Potassium Chloride Crys Cr 20 Meq Tbcr (Potassium chloride crys cr) .... Take 2 tablets by mouth daily 5)  Triamterene-hctz 37.5-25 Mg Tabs (Triamterene-hctz) .... Take 1 tablet by mouth once a day 6)  Metformin Hcl 500 Mg Tabs (Metformin hcl) .... Once tab two times a day 7)  Prilosec Otc 20 Mg  Tbec (Omeprazole magnesium) .... Take 1 tablet by mouth once daily 8)  Aspirin 325 Mg Tabs (Aspirin) .... Take one tablet by mouth daily 9)  Allegra 180 Mg Tabs (Fexofenadine hcl) .... Take one by mouth daily as needed 10)  Flexeril 5 Mg Tabs (Cyclobenzaprine hcl) .... Take 1 tab as needed muscle spasm 11)  Vitamin B-12 500 Mcg Tabs (Cyanocobalamin) .... Take 1 by mouth once daily 12)  Daily Vitamins Tabs (Multiple vitamin) .... Vitamin d-3 take 1 by mouth once daily  Other Orders: TLB-Lipid Panel (80061-LIPID) TLB-Hepatic/Liver Function Pnl (80076-HEPATIC)  Patient Instructions: 1)  Please schedule a follow-up appointment in 6 months .   Current Allergies (reviewed today): ! WELLBUTRIN (BUPROPION HCL) CODEINE PHOSPHATE (CODEINE PHOSPHATE) * XANAX * SERZONE CELEXA PROZAC (FLUOXETINE HCL) NORVASC (AMLODIPINE BESYLATE) * ACE  Appended Document: Orders Update    Clinical Lists Changes  Problems: Added new problem of SCREENING, COLON CANCER (ICD-V76.51) Orders: Added new Referral order of Gastroenterology Referral (GI) - Signed

## 2010-11-20 NOTE — Letter (Signed)
Summary: [facility] Instructions  [facility]  [address] [place], [place] [address]   Phone: [phone]  Fax: [phone]       KIRIN BRANDENBURGER    Nov 26, 1935    MRN: [id number]        Procedure Day Dorna Bloom: Wednesday October 5th, 2011     Arrival Time: 10:30am     Procedure Time: 11:30am     Location of Procedure:                    _x _  [facility] (4th Floor)                        PREPARATION FOR COLONOSCOPY WITH MOVIPREP   Starting 5 days prior to your procedure 07/20/10 do not eat nuts, seeds, popcorn, corn, beans, peas,  salads, or any raw vegetables.  Do not take any fiber supplements (e.g. Metamucil, Citrucel, and Benefiber).  THE DAY BEFORE YOUR PROCEDURE         DATE: 07/24/10  DAY: Tuesday  1.  Drink clear liquids the entire day-NO SOLID FOOD  2.  Do not drink anything colored red or purple.  Avoid juices with pulp.  No orange juice.  3.  Drink at least 64 oz. (8 glasses) of fluid/clear liquids during the day to prevent dehydration and help the prep work efficiently.  CLEAR LIQUIDS INCLUDE: Water Jello Ice Popsicles Tea (sugar ok, no milk/cream) Powdered fruit flavored drinks Coffee (sugar ok, no milk/cream) Gatorade Juice: apple, white grape, white cranberry  Lemonade Clear bullion, consomm, broth Carbonated beverages (any kind) Strained chicken noodle soup Hard Candy                             4.  In the morning, mix first dose of MoviPrep solution:    Empty 1 Pouch A and 1 Pouch B into the disposable container    Add lukewarm drinking water to the top line of the container. Mix to dissolve    Refrigerate (mixed solution should be used within 24 hrs)  5.  Begin drinking the prep at 5:00 p.m. The MoviPrep container is divided by 4 marks.   Every 15 minutes drink the solution down to the next mark (approximately 8 oz) until the full liter is complete.   6.  Follow completed prep with 16 oz of clear liquid of your  choice (Nothing red or purple).  Continue to drink clear liquids until bedtime.  7.  Before going to bed, mix second dose of MoviPrep solution:    Empty 1 Pouch A and 1 Pouch B into the disposable container    Add lukewarm drinking water to the top line of the container. Mix to dissolve    Refrigerate  THE DAY OF YOUR PROCEDURE      DATE: 07/25/10 DAY: Wednesday  Beginning at 6:30 a.m. (5 hours before procedure):         1. Every 15 minutes, drink the solution down to the next mark (approx 8 oz) until the full liter is complete.  2. Follow completed prep with 16 oz. of clear liquid of your choice.    3. You may drink clear liquids until 9:30am (2 HOURS BEFORE PROCEDURE).   MEDICATION INSTRUCTIONS  Unless otherwise instructed, you should take regular prescription medications with a small sip of water   as early as possible the morning of your  procedure.  Diabetic patients - see separate instructions.        OTHER INSTRUCTIONS  You will need a responsible adult at least 73 years of age to accompany you and drive you home.   This person must remain in the waiting room during your procedure.  Wear loose fitting clothing that is easily removed.  Leave jewelry and other valuables at home.  However, you may wish to bring a book to read or  an iPod/MP3 player to listen to music as you wait for your procedure to start.  Remove all body piercing jewelry and leave at home.  Total time from sign-in until discharge is approximately 2-3 hours.  You should go home directly after your procedure and rest.  You can resume normal activities the  day after your procedure.  The day of your procedure you should not:   Drive   Make legal decisions   Operate machinery   Drink alcohol   Return to work  You will receive specific instructions about eating, activities and medications before you leave.    The above instructions have been reviewed and explained to me by    _______________________    I fully understand and can verbalize these instructions _____________________________ Date _________

## 2010-11-20 NOTE — Progress Notes (Signed)
Summary: Rx Clorazepate  Phone Note [facility]ill Request [facility] on July 18, 2010 2:29 PM  Refills Requested: Medication #1:  CLORAZEPATE DIPOTASSIUM 7.5 MG TABS Take 1 tablet by mouth three times a day as needed for anxiety   Last Refilled: 12/18/2009  Method Requested: Telephone to Pharmacy Initial call taken by: Sydell Axon LPN,  July 18, 2010 2:29 PM  Follow-up for Phone Call        okay #90 x 1 Follow-up by: Cindee Salt MD,  July 19, 2010 1:18 PM  Additional Follow-up for Phone Call Additional follow up Details #1::        Rx called to pharmacy Additional Follow-up by: Linde Gillis CMA Duncan Dull),  July 19, 2010 5:04 PM    Prescriptions: CLORAZEPATE DIPOTASSIUM 7.5 MG TABS (CLORAZEPATE DIPOTASSIUM) Take 1 tablet by mouth three times a day as needed for anxiety  #90 x 1   Entered by:   Linde Gillis CMA (AAMA)   Authorized by:   Cindee Salt MD   Signed by:   Linde Gillis CMA (AAMA) on 07/19/2010   Method used:   Telephoned to ...       [id number]       [address]       [place], [place]  [address]       Ph: [phone] or [phone]       Fax: [phone]   RxID:   [id number]

## 2010-11-20 NOTE — Assessment & Plan Note (Signed)
Summary: GERD, gas,  screening colonoscopy   History of Present Illness Visit Type: consult Primary GI MD: Yancey Flemings MD Primary Provider: Cindee Salt MD Requesting Provider: Tillman Abide, MD Chief Complaint: problems with gas, GERD, and need for colonoscopy History of Present Illness:   75 year old female with hypertension, hyperlipidemia, hypothyroidism, type 2 diabetes mellitus, anxiety/depression, prior CVA, and GERD. She presents today regarding several issues. First, she reports a greater than 10 year history of problems with GERD. PPI therapy helps, but she does have occasional breakthrough. Complains about cost of drug and is currently on no acid suppressive therapy with significant symptoms. No dysphagia. No prior screening endoscopy. Next, she reports problems with increased intestinal gas and bloating. She has taken probiotic previously, which has helped, but states cannot afford this. Finally, she has been encouraged on a number of occasions to undergo screening colonoscopy. At the urging of her primary care physician, she wishes to discuss this further and possibly set up the examination. Her other chronic medical problems are stable. Medications are stable.Marland Kitchen   GI Review of Systems    Reports acid reflux, bloating, and  weight gain.      Denies abdominal pain, belching, chest pain, dysphagia with liquids, dysphagia with solids, heartburn, loss of appetite, nausea, vomiting, vomiting blood, and  weight loss.      Reports change in bowel habits and  hemorrhoids.     Denies anal fissure, black tarry stools, constipation, diarrhea, diverticulosis, fecal incontinence, heme positive stool, irritable bowel syndrome, jaundice, light color stool, liver problems, rectal bleeding, and  rectal pain. Preventive Screening-Counseling & Management      Drug Use:  no.      Current Medications (verified): 1)  Levothyroxine Sodium 25 Mcg Tabs (Levothyroxine Sodium) .... Take 1 By Mouth  Once Daily 2)  Clorazepate Dipotassium 7.5 Mg Tabs (Clorazepate Dipotassium) .... Take 1 Tablet By Mouth Three Times A Day As Needed For Anxiety 3)  Potassium Chloride Crys Cr 20 Meq Tbcr (Potassium Chloride Crys Cr) .... Take 2 Tablets By Mouth Daily 4)  Triamterene-Hctz 37.5-25 Mg Tabs (Triamterene-Hctz) .... Take 1 Tablet By Mouth Once A Day 5)  Metformin Hcl 500 Mg  Tabs (Metformin Hcl) .... Once Tab Two Times A Day 6)  Aspirin 325 Mg Tabs (Aspirin) .... Take One Tablet By Mouth Daily 7)  Allegra 180 Mg  Tabs (Fexofenadine Hcl) .... Take One By Mouth Daily As Needed 8)  Flexeril 5 Mg Tabs (Cyclobenzaprine Hcl) .... Take 1 Tab As Needed Muscle Spasm 9)  Vitamin B-12 1000 Mcg Tabs (Cyanocobalamin) .... Take 1 Tablet By Mouth Once A Day 10)  Daily Vitamins  Tabs (Multiple Vitamin) .... Vitamin D-3 Take 1 By Mouth Once Daily 11)  Nasonex 50 Mcg/act Susp (Mometasone Furoate) .... Use 2 Sprays Daily As Needed 12)  Vitamin E 200 Unit Caps (Vitamin E) .... Take 1 Capsule By Mouth Once A Day  Allergies (verified): 1)  ! Wellbutrin (Bupropion Hcl) 2)  Codeine Phosphate (Codeine Phosphate) 3)  * Xanax 4)  * Serzone 5)  Celexa 6)  Prozac (Fluoxetine Hcl) 7)  Norvasc (Amlodipine Besylate) 8)  * Ace  Past History:  Past Medical History: Reviewed history from 03/14/2010 and no changes required. Allergic rhinitis Anxiety Depression Diabetes mellitus, type II GERD Hypertension CVA--5/09 Hypothyroidism Hyperlipidemia  Past Surgical History: Reviewed history from 10/02/2009 and no changes required. Cholecystectomy--1950's Tonsillectomy VD x5 Tubal ligation in 1960's Vein treatment--laser/injections    2006 Rgiht knee meniscus repair-Dr Ranell Patrick  11/08 TKR 5/09--CVA after Ranell Patrick) Cataract left eye----------Dr Dingledein  Family History: Mom had CAD and vascular disease Family History of Breast Cancer: Sister, Mother, MGM, Mat Aunts, Mat Cousins Family History of Ovarian Cancer:  Mother Family History of Diabetes: ? 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  The patient denies anemia, arthritis/joint pain, back pain, blood in urine, breast changes/lumps, confusion, coughing up blood, fainting, fatigue, fever, headaches-new, hearing problems, heart murmur, heart rhythm changes, itching, menstrual pain, night sweats, nosebleeds, pregnancy symptoms, shortness of breath, skin rash, sore throat, swelling of feet/legs, swollen lymph glands, thirst - excessive, urination - excessive, urination changes/pain, and voice change.    Vital Signs:  Patient profile:   75 year old female Height:      65 inches Weight:      216 pounds BMI:     36.07 Pulse rate:   88 / minute Pulse rhythm:   regular BP sitting:   120 / 74  (left arm) Cuff size:   large  Vitals Entered By: Francee Piccolo CMA Duncan Dull) (July 17, 2010 1:33 PM)  Physical Exam  General:  Well developed, obese, well nourished, no acute distress. Head:  Normocephalic and atraumatic. Eyes:  PERRLA, no icterus. Mouth:  No deformity or lesions. Neck:  Supple; no masses or thyromegaly. Lungs:  Clear throughout to auscultation. Heart:  Regular rate and rhythm; no murmurs, rubs,  or bruits. Abdomen:  Soft, obese,nontender and nondistended. No masses, hepatosplenomegaly or hernias noted. Normal bowel sounds. Rectal:  deferred until colonoscopy Msk:  Symmetrical with no gross deformities. Normal posture. Pulses:  Normal pulses noted. Extremities:  No clubbing, cyanosis, edema or deformities  noted. Neurologic:  Alert and  oriented x4;  grossly normal neurologically. Skin:  Intact without significant lesions or rashes. Psych:  Alert and cooperative. Normal mood and affect.   Impression & Recommendations:  Problem # 1:  GERD (ICD-530.81) greater than 10 year history of GERD and an obese Caucasian smoker. Requires PPI for control.  Plan: #1. Reflux precautions #2. Omeprazole 40 mg daily prescribed #3. Consider screening endoscopy after patient has been on PPI for 8 weeks  Problem # 2:  FLATULENCE ERUCTATION AND GAS PAIN (ICD-787.3) chronic intermittent problems with increased intestinal gas.  Plan: #1. Educational literature on gas. #2. Anti-gas and flatulence dietary sheet provided #3. Probiotic Align samples provided x2 weeks  Problem # 3:  SCREENING, COLON CANCER (ICD-V76.51) screening colonoscopy. The patient is an appropriate had a without contraindication. The nature of the procedure as well as the risks, benefits, and alternatives were reviewed. She understood and agreed to proceed. Movi prep prescribed (FREE  Movi prep given to the patient).. The patient instructed on its use. We will hold her diabetic medications the morning of the procedure until she has resumed normal oral intake.  Problem # 4:  DIABETES MELLITUS, TYPE II (ICD-250.00) adjustment diabetic medications for the procedure. As well the immediate pre-and post procedure monitoring of blood sugar.  Other Orders: Colonoscopy (Colon)  Patient Instructions: 1)  Start Align one tablet by mouth once daily x 2 weeks and samples given.  2)  Pick up your prescription from your pharmacy.  3)  Colonoscopy and Flexible Sigmoidoscopy brochure given.  4)  Avoid foods high in acid content ( tomatoes, citrus juices, spicy foods) . Avoid eating within 3 to 4 hours of lying down or before exercising. Do not over eat; try smaller more frequent meals. Elevate head of bed four inches when sleeping.  5)  Liquids and  foods should be eaten in small, frequent meals. Refer to brochure for further instruction.  6)  Copy sent to : Tillman Abide, MD 7)  The medication list was reviewed and reconciled.  All changed / newly prescribed medications were explained.  A complete medication list was provided to the patient / caregiver. Prescriptions: OMEPRAZOLE 20 MG CPDR (OMEPRAZOLE) one capsule by mouth once daily  #30 x 11   Entered by:   Christie Nottingham CMA (AAMA)   Authorized by:   Hilarie Fredrickson MD   Signed by:   Christie Nottingham CMA (AAMA) on 07/17/2010   Method used:   Electronically to        [id number] * (retail)       [address]       [place], [place]  [address]       Ph: [phone] or [phone]       Fax: [phone]   RxID:   [id number]

## 2010-11-20 NOTE — Procedures (Signed)
Summary: Colonoscopy  Patient: [name] Note: All result statuses are Final unless otherwise noted.  Tests: (1) Colonoscopy (COL)   COL Colonoscopy           DONE     [facility]     [address]     [place], [place]  [address]           COLONOSCOPY PROCEDURE REPORT           PATIENT:  Sheila Cunningham, Sheila Cunningham  MR#:  [id number]     BIRTHDATE:  1936/06/02, 73 yrs. old  GENDER:  female     ENDOSCOPIST:  Wilhemina Bonito. Eda Keys, MD     REF. BY:  Tillman Abide, M.D.     PROCEDURE DATE:  07/25/2010     PROCEDURE:  Colonoscopy with snare polypectomy x 1     ASA CLASS:  Class II     INDICATIONS:  Routine Risk Screening     MEDICATIONS:   Fentanyl 125 mcg IV, Versed 13 mg IV, Benadryl 50     mg IV           DESCRIPTION OF PROCEDURE:   After the risks benefits and     alternatives of the procedure were thoroughly explained, informed     consent was obtained.  Digital rectal exam was performed and     revealed no abnormalities.   The LB [id number] endoscope     was introduced through the anus and advanced to the cecum, which     was identified by both the appendix and ileocecal valve, without     limitations.Time to cecum = 15:26 min (side - side) The quality of     the prep was excellent, using MoviPrep.  The instrument was then     slowly withdrawn (17:31 min)  as the colon was fully examined.     <<PROCEDUREIMAGES>>           FINDINGS:  A diminutive 2mm polyp was found in the ascending     colon. Polyp was snared without cautery. Retrieval was successful.     Mild diverticulosis was found in the sigmoid colon.  Melanosis     coli was found throughout the colon.   Retroflexed views in the     rectum revealed internal hemorrhoids.    The scope was then     withdrawn from the patient and the procedure completed.           COMPLICATIONS:  None     ENDOSCOPIC IMPRESSION:     1) Diminutive polyp in the ascending colon - removed     2) Mild diverticulosis in the sigmoid colon  3) Melanosis throughout the colon     4) Internal hemorrhoids           RECOMMENDATIONS:     1) Repeat colonoscopy in 5 years WITH PROPOFOL if polyp     adenomatous; otherwise PRN     ______________________________     Wilhemina Bonito. Eda Keys, MD           CC:  Karie Schwalbe, MD; The Patient           n.     eSIGNED:   Wilhemina Bonito. Eda Keys at 07/25/2010 01:59 PM           [name], [id number]  Note: An exclamation mark (!) indicates a result that was not dispersed into the flowsheet. Document Creation Date: 07/25/2010 2:03 PM _______________________________________________________________________  (1)  Order result status: Final Collection or observation date-time: 07/25/2010 13:50 Requested date-time:  Receipt date-time:  Reported date-time:  Referring Physician:   Ordering Physician: Fransico Setters [phone]) Specimen Source:  Source: Launa Grill Order Number: [id number] Lab site:

## 2010-11-20 NOTE — Progress Notes (Signed)
Summary: pt needs refill simvastation  Phone Note Refill Request Call back at Home Phone [phone] Message from:  [facility] [address]  Refills Requested: Medication #1:  ZOCOR 80 MG TABS take on tab by mouth at bedtime pt needs a call when its done she is out  Initial call taken by: Omer Jack,  November 13, 2009 9:19 AM    Prescriptions: ZOCOR 80 MG TABS (SIMVASTATIN) take on tab by mouth at bedtime  #30 x 6   Entered by:   Burnett Kanaris, CNA   Authorized by:   Sherrill Raring, MD, [facility]   Signed by:   Burnett Kanaris, CNA on 11/13/2009   Method used:   [facility]        [id number]       [address]       [place], [place]  [address]       Ph: [phone] or [phone]       Fax: [phone]   RxID:   [id number]

## 2010-11-20 NOTE — Assessment & Plan Note (Signed)
Summary: 6 MONTH FOLLOW UP/RBH   Vital Signs:  Patient profile:   75 year old female Weight:      210 pounds Temp:     98.3 degrees F oral BP sitting:   130 / 80  (left arm) Cuff size:   large  Vitals Entered By: Mervin Hack CMA Duncan Dull) (September 17, 2010 3:35 PM) CC: 6 month follow-up   History of Present Illness: DOing okay having another great grandchild  went to seminar at [facility] on strokes Found it enlightening recent eye exam  Not checking sugars lately meter not working  May be somewhat high No hypoglycemic reactions  Recent stress test No chest pain No SOB No edema  Mood is up and down at times no persistent depression  Allergies: 1)  ! Wellbutrin (Bupropion Hcl) 2)  Codeine Phosphate (Codeine Phosphate) 3)  * Xanax 4)  * Serzone 5)  Celexa 6)  Prozac (Fluoxetine Hcl) 7)  Norvasc (Amlodipine Besylate) 8)  * Ace  Past History:  Past medical, surgical, family and social histories (including risk factors) reviewed for relevance to current acute and chronic problems.  Past Medical History: Reviewed history from 03/14/2010 and no changes required. Allergic rhinitis Anxiety Depression Diabetes mellitus, type II GERD Hypertension CVA--5/09 Hypothyroidism Hyperlipidemia  Past Surgical History: Reviewed history from 10/02/2009 and no changes required. Cholecystectomy--1950's Tonsillectomy VD x5 Tubal ligation in 1960's Vein treatment--laser/injections    2006 Rgiht knee meniscus repair-Dr Ranell Patrick  11/08 TKR 5/09--CVA after Ranell Patrick) Cataract left eye----------Dr Dingledein  Family History: Reviewed history from 07/17/2010 and no changes required. Mom had CAD and vascular disease Family History of Breast Cancer: Sister, Mother, MGM, Mat Aunts, Mat Cousins Family History of Ovarian Cancer: Mother Family History of Diabetes: ? father side of family Family History of Heart Disease: Mother-CHF,   Social History: Reviewed history from  07/17/2010 and no changes required. 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 Still sleepy in AM but okay if she is busy sleep study was reassured  Physical Exam  General:  alert and normal appearance.   Neck:  supple, no masses, no thyromegaly, no carotid bruits, and no cervical lymphadenopathy.   Lungs:  normal respiratory effort, no intercostal retractions, no accessory muscle use, and normal breath sounds.   Heart:  normal rate, regular rhythm, no murmur, and no gallop.   Pulses:  1+ in feet Extremities:  no edema Psych:  normally interactive, good eye contact, not anxious appearing, and not depressed appearing.    Diabetes Management Exam:    Foot Exam (with socks and/or shoes not present):       Sensory-Pinprick/Light touch:          Left medial foot (L-4): normal          Left dorsal foot (L-5): normal          Left lateral foot (S-1): normal          Right medial foot (L-4): normal          Right dorsal foot (L-5): normal          Right lateral foot (S-1): normal       Inspection:          Left  foot: normal          Right foot: normal       Nails:          Left foot: normal          Right foot: normal    Eye Exam:       Eye Exam done elsewhere          Date: 09/10/2010          Results: no retinopathy, cataracts and needed additional laser for this          Done by: Dr Dingledein   Impression & Recommendations:  Problem # 1:  DIABETES MELLITUS, TYPE II (ICD-250.00) Assessment Unchanged  hopefully still reasonable control  Rx for new meter sent  Her updated medication list for this problem includes:    Metformin Hcl 500 Mg Tabs (Metformin hcl) ..... Once tab two times a day    Aspirin  325 Mg Tabs (Aspirin) .Marland Kitchen... Take one tablet by mouth daily  Labs Reviewed: Creat: 0.9 (10/02/2009)     Last Eye Exam: no retinopathy, cataracts and needed additional laser for this (09/10/2010) Reviewed HgBA1c results: 7.2 (03/14/2010)  7.4 (10/02/2009)  Orders: Venipuncture (62[address]) TLB-A1C / Hgb A1C (Glycohemoglobin) (83036-A1C)  Problem # 2:  CEREBROVASCULAR ACCIDENT, LATE EFFECTS (ICD-438.9) Assessment: Unchanged minimal deficits Good Bp control  Her updated medication list for this problem includes:    Aspirin 325 Mg Tabs (Aspirin) .Marland Kitchen... Take one tablet by mouth daily  Problem # 3:  HYPERTENSION (ICD-401.9) Assessment: Unchanged good control  Her updated medication list for this problem includes:    Triamterene-hctz 37.5-25 Mg Tabs (Triamterene-hctz) .Marland Kitchen... Take 1 tablet by mouth once a day  BP today: 130/80 Prior BP: 120/74 (07/17/2010)  Labs Reviewed: K+: 4.2 (10/02/2009) Creat: : 0.9 (10/02/2009)   Chol: 142 (03/14/2010)   HDL: 61.70 (03/14/2010)   LDL: 45 (03/14/2010)   TG: 175.0 (03/14/2010)  Problem # 4:  SLEEP DISORDER (ICD-780.50) Assessment: Unchanged not apnea naps if she needs to  prefers no meds  Complete Medication List: 1)  Levothyroxine Sodium 25 Mcg Tabs (Levothyroxine sodium) .... Take 1 by mouth once daily 2)  Clorazepate Dipotassium 7.5 Mg Tabs (Clorazepate dipotassium) .... Take 1 tablet by mouth three times a day as needed for anxiety 3)  Potassium Chloride Crys Cr 20 Meq Tbcr (Potassium chloride crys cr) .... Take 2 tablets by mouth daily 4)  Triamterene-hctz 37.5-25 Mg Tabs (Triamterene-hctz) .... Take 1 tablet by mouth once a day 5)  Metformin Hcl 500 Mg Tabs (Metformin hcl) .... Once tab two times a day 6)  Flexeril 5 Mg Tabs (Cyclobenzaprine hcl) .... Take 1 tab as needed muscle spasm 7)  Nasonex 50 Mcg/act Susp (Mometasone furoate) .... Use 2 sprays daily as needed 8)  Omeprazole 20 Mg Cpdr (Omeprazole) .... One capsule by mouth once  daily 9)  Aspirin 325 Mg Tabs (Aspirin) .... Take one tablet by mouth daily 10)  Vitamin B-12 1000 Mcg Tabs (Cyanocobalamin) .... Take 1 tablet by mouth once a day 11)  Daily Vitamins Tabs (Multiple vitamin) .... Vitamin d-3 take 1 by mouth once daily 12)  Loratadine 10 Mg Tabs (Loratadine) .... Take 1 by mouth once daily 13)  Blood Glucose Meter Kit (Blood glucose monitoring suppl) .... Patient tests once daily dx:250.00 14)  Blood Glucose Test Strp (Glucose blood) .... Patient tests once daily dx: 250.00  Other Orders: Pneumococcal Vaccine (86578) Admin 1st Vaccine (46962)  Patient Instructions: 1)  Please  schedule a follow-up appointment in 6 months .  Prescriptions: BLOOD GLUCOSE TEST  STRP (GLUCOSE BLOOD) patient tests once daily dx: 250.00  #34mth supply x 12   Entered by:   Mervin Hack CMA (AAMA)   Authorized by:   Cindee Salt MD   Signed by:   Mervin Hack CMA (AAMA) on 09/17/2010   Method used:   Electronically to        [facility]* (retail)       [address] [place], [place]  [address]       Ph: [phone]       Fax: [phone]   RxID:   [id number] BLOOD GLUCOSE METER  KIT (BLOOD GLUCOSE MONITORING SUPPL) patient tests once daily dx:250.00  #1 x 0   Entered by:   Mervin Hack CMA (AAMA)   Authorized by:   Cindee Salt MD   Signed by:   Mervin Hack CMA (AAMA) on 09/17/2010   Method used:   Electronically to        [facility]* (retail)       [address] [place], [place]  [address]       Ph: [phone]       Fax: [phone]   RxID:   [id number]    Orders Added: 1)  Venipuncture [[address]] 2)  TLB-A1C / Hgb A1C (Glycohemoglobin) [83036-A1C] 3)  Est. Patient Level IV [93235] 4)  Pneumococcal Vaccine [90732] 5)  Admin 1st Vaccine [57322]   Immunizations Administered:  Pneumonia Vaccine:    Vaccine Type: Pneumovax    Site: left deltoid    Mfr:  Merck    Dose: 0.5 ml    Route: IM    Given by: Mervin Hack CMA (AAMA)    Exp. Date: 02/15/2012    Lot #: [id number]    VIS given: 09/25/09 version given September 17, 2010.   Immunizations Administered:  Pneumonia Vaccine:    Vaccine Type: Pneumovax    Site: left deltoid    Mfr: Merck    Dose: 0.5 ml    Route: IM    Given by: Mervin Hack CMA (AAMA)    Exp. Date: 02/15/2012    Lot #: [id number]    VIS given: 09/25/09 version given September 17, 2010.  Current Allergies (reviewed today): ! WELLBUTRIN (BUPROPION HCL) CODEINE PHOSPHATE (CODEINE PHOSPHATE) * XANAX * SERZONE CELEXA PROZAC (FLUOXETINE HCL) NORVASC (AMLODIPINE BESYLATE) * ACE

## 2010-11-20 NOTE — Letter (Signed)
Summary: New Patient letter  [facility]  [address] [place], [place] [address]   Phone: [phone]  Fax: [phone]       03/16/2010 MRN: [id number]  Sheila Cunningham [address] [place], [place]  [address]  Dear Ms. Leopard,  Welcome to the Gastroenterology Division at [facility].    You are scheduled to see Dr. Marina Goodell on 04/30/2010 at 10:00AM on the 3rd floor at [facility], [address]  We ask that you try to arrive at our office 15 minutes prior to your appointment time to allow for check-in.  We would like you to complete the enclosed self-administered evaluation form prior to your visit and bring it with you on the day of your appointment.  We will review it with you.  Also, please bring a complete list of all your medications or, if you prefer, bring the medication bottles and we will list them.  Please bring your insurance card so that we may make a copy of it.  If your insurance requires a referral to see a specialist, please bring your referral form from your primary care physician.  Co-payments are due at the time of your visit and may be paid by cash, check or credit card.     Your office visit will consist of a consult with your physician (includes a physical exam), any laboratory testing he/she may order, scheduling of any necessary diagnostic testing (e.g. x-ray, ultrasound, CT-scan), and scheduling of a procedure (e.g. Endoscopy, Colonoscopy) if required.  Please allow enough time on your schedule to allow for any/all of these possibilities.    If you cannot keep your appointment, please call [phone] to cancel or reschedule prior to your appointment date.  This allows [place] the opportunity to schedule an appointment for another patient in need of care.  If you do not cancel or reschedule by 5 p.m. the business day prior to your appointment date, you will be charged a $50.00 late cancellation/no-show fee.    Thank you for choosing [facility] for your medical needs.  We appreciate the opportunity to care for you.  Please visit [place] at our website  to learn more about our practice.                     Sincerely,                                                             The Gastroenterology Division

## 2010-11-20 NOTE — Progress Notes (Signed)
Summary: refill request for clorazepate  Phone Note Refill Request Message from:  Fax from Pharmacy  Refills Requested: Medication #1:  CLORAZEPATE DIPOTASSIUM 7.5 MG TABS Take 1 tablet by mouth three times a day   Last Refilled: 02/16/2010 Faxed request from [facility] is on your desk.  Initial call taken by: Lowella Petties CMA,  Mar 20, 2010 8:24 AM  Follow-up for Phone Call        okay #90 x 1 change to as needed for anxiety Follow-up by: Cindee Salt MD,  Mar 20, 2010 9:06 AM  Additional Follow-up for Phone Call Additional follow up Details #1::        Rx faxed to pharmacy Additional Follow-up by: DeShannon Smith CMA Duncan Dull),  Mar 20, 2010 9:35 AM    New/Updated Medications: CLORAZEPATE DIPOTASSIUM 7.5 MG TABS (CLORAZEPATE DIPOTASSIUM) Take 1 tablet by mouth three times a day as needed for anxiety Prescriptions: CLORAZEPATE DIPOTASSIUM 7.5 MG TABS (CLORAZEPATE DIPOTASSIUM) Take 1 tablet by mouth three times a day as needed for anxiety  #90 x 1   Entered by:   Mervin Hack CMA (AAMA)   Authorized by:   Cindee Salt MD   Signed by:   Mervin Hack CMA (AAMA) on 03/20/2010   Method used:   Handwritten   RxID:   [id number]

## 2010-11-20 NOTE — Letter (Signed)
Summary: Results Follow up Letter  [place] [facility]  [address] [place], [place] [address]   Phone: [phone]  Fax: [phone]    03/09/2010 MRN: [id number]  Sheila Cunningham [address] [place], [place]  [address]  Dear [name],  The following are the results of your recent test(s):  Test         Result    Pap Smear:        Normal _____  Not Normal _____ Comments: ______________________________________________________ Cholesterol: LDL(Bad cholesterol):         Your goal is less than:         HDL (Good cholesterol):       Your goal is more than: Comments:  ______________________________________________________ Mammogram:        Normal __X___  Not Normal _____ Comments:Mammo looks fine, repeat recommended in 1-2 years  ___________________________________________________________________ Hemoccult:        Normal _____  Not normal _______ Comments:    _____________________________________________________________________ Other Tests:    We routinely do not discuss normal results over the telephone.  If you desire a copy of the results, or you have any questions about this information we can discuss them at your next office visit.   Sincerely,      Tillman Abide, MD

## 2010-11-20 NOTE — Assessment & Plan Note (Signed)
Summary: tired and weak/ last seen 07/2008/jr   Visit Type:  Follow-up Primary Nicolet Griffy:  Cindee Salt MD  CC:  fatigue-no energy.  History of Present Illness: Patient is a 75 year old women.  I saw her back in 2009 when she was sent for evlauation of abnormal TEE.  the patient had a stroke after knee surgery.  Work up included a TEE that was suspicious for a PFO.  She was referred to [facility] for possible device closure.  She was seen by K. Romeo Apple who reviewed studies and felt the patient had a septal fenestratin and not a PFO.  He recommended medical therapy. She comes in today complaining of fatigue, lack of energy.  She just doesn't feel like doing things.  when she does things like mowing the lawn she is getting more short of breath   this has gotten worse over the past year.  Denies chest pains.   Complains of weakiness still in L hand and L leg. Occasional achiness in lower legs Concerned that Zocor is making her feel bad. Does say she snores at night.  Wakes up occasionally chortling.  Current Medications (verified): 1)  Zocor 80 Mg Tabs (Simvastatin) .Marland Kitchen.. 1  Tab By Mouth At Bedtime 2)  Levothyroxine Sodium 25 Mcg Tabs (Levothyroxine Sodium) .... Take 1 By Mouth Once Daily 3)  Clorazepate Dipotassium 7.5 Mg Tabs (Clorazepate Dipotassium) .... Take 1 Tablet By Mouth Three Times A Day As Needed For Anxiety 4)  Potassium Chloride Crys Cr 20 Meq Tbcr (Potassium Chloride Crys Cr) .... Take 2 Tablets By Mouth Daily 5)  Triamterene-Hctz 37.5-25 Mg Tabs (Triamterene-Hctz) .... Take 1 Tablet By Mouth Once A Day 6)  Metformin Hcl 500 Mg  Tabs (Metformin Hcl) .... Once Tab Two Times A Day 7)  Aspirin 325 Mg Tabs (Aspirin) .... Take One Tablet By Mouth Daily 8)  Allegra 180 Mg  Tabs (Fexofenadine Hcl) .... Take One By Mouth Daily As Needed 9)  Flexeril 5 Mg Tabs (Cyclobenzaprine Hcl) .... Take 1 Tab As Needed Muscle Spasm 10)  Vitamin B-12 500 Mcg Tabs (Cyanocobalamin) .... Take 1 By Mouth  Once Daily 11)  Daily Vitamins  Tabs (Multiple Vitamin) .... Vitamin D-3 Take 1 By Mouth Once Daily 12)  Amoxicillin 500 Mg Tabs (Amoxicillin) .... 2 Tabs By Mouth Two Times A Day For Sinus Infection  Allergies (verified): 1)  ! Wellbutrin (Bupropion Hcl) 2)  Codeine Phosphate (Codeine Phosphate) 3)  * Xanax 4)  * Serzone 5)  Celexa 6)  Prozac (Fluoxetine Hcl) 7)  Norvasc (Amlodipine Besylate) 8)  * Ace  Past History:  Past medical, surgical, family and social histories (including risk factors) reviewed, and no changes noted (except as noted below).  Past Medical History: Reviewed history from 03/14/2010 and no changes required. Allergic rhinitis Anxiety Depression Diabetes mellitus, type II GERD Hypertension CVA--5/09 Hypothyroidism Hyperlipidemia  Past Surgical History: Reviewed history from 10/02/2009 and no changes required. Cholecystectomy--1950's Tonsillectomy VD x5 Tubal ligation in 1960's Vein treatment--laser/injections    2006 Rgiht knee meniscus repair-Dr Ranell Patrick  11/08 TKR 5/09--CVA after Ranell Patrick) Cataract left eye----------Dr Dingledein PMH-FH-SH reviewed-no changes except otherwise noted  Family History: Reviewed history from 02/03/2007 and no changes required. Mom had CAD and vascular disease  Social History: Reviewed history from 11/06/2007 and no changes required. 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  Vital Signs:  Patient profile:   75 year old female Height:      65 inches Weight:      214 pounds BMI:     35.74 Pulse rate:   91 / minute BP sitting:   137 / 85  (left arm) Cuff size:   large  Vitals Entered By: Burnett Kanaris, CNA (June 18, 2010 12:07 PM)   Physical Exam  Additional Exam:  Patient is in NAD HEENT:  Normocephalic, atraumatic. EOMI,  PERRLA.  Neck: JVP is normal. No thyromegaly. No bruits.  Lungs: clear to auscultation. No rales no wheezes.  Heart: Regular rate and rhythm. Normal S1, S2. No S3.   No significant murmurs. PMI not displaced.  Abdomen:  Supple, nontender. Normal bowel sounds. No masses. No hepatomegaly.  Extremities:   Good distal pulses throughout. No lower extremity edema.  Musculoskeletal :moving all extremities.  Neuro:   alert and oriented x3.    EKG  Procedure date:  06/18/2010  Findings:      NSR.  91 bpm.  Impression & Recommendations:  Problem # 1:  OTHER DYSPNEA AND RESPIRATORY ABNORMALITIES (ICD-786.09) I am not sure what this fatigue represents.  With progressive dyspnea on exertion (in setting of other medical problems) I would recommend a stess test to r/o ischemia.  Continue activiites as tolerated Will check CBC and TSH Patient convinced some of problem is due to Zocor.  Can hold With fatigue and snoring history will set up for sleep study.  Problem # 2:  HYPERLIPIDEMIA (ICD-272.4) Excellent numbers (LDL 45, HDL 62).  Patient thinks some of symptoms may be due to statin.  Can hold for now.  Call back in 3 wks with respons.e Her updated medication list for this problem includes:    Zocor 80 Mg Tabs (Simvastatin) .Marland Kitchen... 1  tab by mouth at bedtime  Other Orders: EKG w/ Interpretation (93000) TLB-TSH (Thyroid Stimulating Hormone) (84443-TSH) TLB-CBC Platelet - w/Differential (85025-CBCD) Sleep Disorder Referral (Sleep Disorder) Nuclear Stress Test (Nuc Stress Test)  Patient Instructions: 1)  Your physician recommends that you return for lab work in:tsh and cbc today 2)  Your physician has requested that you have an adenosine myoview.  For further information please visit [email or web address]  Please follow instruction sheet, as given. 3)  Your physician has recommended that you have a sleep study.  This test records several body functions during sleep, including:  brain activity,  eye movement, oxygen and carbon dioxide blood levels, heart rate and rhythm, breathing rate and rhythm, the flow of air through your mouth and nose, snoring, body muscle movements, and chest and belly movement.  Appended Document: tired and weak/ last seen 07/2008/jr Called patient in follow up. She has been off Simvastatin for about 3 weeks and feels much better with increased energy level. Advised will let Dr.Ross know and call her back with any recommendations.   Appended Document: tired and weak/ last seen 07/2008/jr Set her  up for fasting lipids in 9 months.  Appended Document: tired and weak/ last seen 07/2008/jr Patient aware. Order mailed to her home to have labs done near [place] in May of 2012.  Appended Document: tired and weak/ last seen 07/2008/jr Called patient with results of sleep study with diagnosis of insomnia.

## 2010-11-20 NOTE — Progress Notes (Signed)
Summary: Nuclear pre procedure  Phone Note Outgoing Call Call back at [facility] Phone [phone]   Call placed by: Rea College, CMA,  July 02, 2010 2:38 PM Call placed to: Patient Summary of Call: Reviewed information on Myoview Information Sheet (see scanned document for further details).  Spoke with patient.      Nuclear Med Background Indications for Stress Test: Evaluation for Ischemia   History: Echo   Symptoms: DOE, Fatigue    Nuclear Pre-Procedure Cardiac Risk Factors: CVA, Family History - CAD, History of Smoking, Hypertension, Lipids, NIDDM Height (in): 65

## 2010-11-20 NOTE — Letter (Signed)
Summary: Patient Notice- Polyp Results  [facility]  [address] [place], [place] [address]   Phone: [phone]  Fax: [phone]        July 26, 2010 MRN: [id number]    Sheila Cunningham [address] [place], [place]  [address]    Dear Ms. Mccubbins,  I am pleased to inform you that the colon polyp removed during your recent colonoscopy was benign (no cancer detected). The specimen was too tiny for characterization.  Given these findings, and her age,I do not recommend you have a repeat colonoscopy examination for routine screening purposes.   However,Should you develop new or worsening symptoms of abdominal pain, bowel habit changes or bleeding from the rectum or bowels, please schedule an evaluation with either your primary care physician or with me.    Additional information/recommendations:  __ No further action with gastroenterology is needed at this time. Please      follow-up with your primary care physician for your other healthcare      needs.    Please call us if you are having persistent problems or have questions about your condition that have not been fully answered at this time.  Sincerely,  Hilarie Fredrickson MD  This letter has been electronically signed by your physician.  Appended Document: Patient Notice- Polyp Results letter mailed

## 2010-11-20 NOTE — Progress Notes (Signed)
Summary: refill request for clorazepate  Phone Note Refill Request Message from:  Fax from Pharmacy  Refills Requested: Medication #1:  CLORAZEPATE DIPOTASSIUM 7.5 MG TABS Take 1 tablet by mouth three times a day   Last Refilled: 12/20/2009 [facility] is on your desk.  Initial call taken by: Lowella Petties CMA,  January 18, 2010 9:17 AM  Follow-up for Phone Call        okay #90 x 1 Follow-up by: Cindee Salt MD,  January 18, 2010 1:22 PM  Additional Follow-up for Phone Call Additional follow up Details #1::        [phone] Additional Follow-up by: Mervin Hack CMA Duncan Dull),  January 18, 2010 3:02 PM    Prescriptions: CLORAZEPATE DIPOTASSIUM 7.5 MG TABS (CLORAZEPATE DIPOTASSIUM) Take 1 tablet by mouth three times a day  #90 x 1   Entered by:   Mervin Hack CMA (AAMA)   Authorized by:   Cindee Salt MD   Signed by:   Mervin Hack CMA (AAMA) on 01/18/2010   Method used:   Handwritten   RxID:   [id number]

## 2010-11-20 NOTE — Letter (Signed)
Summary: Diabetic Instructions  [facility]  [address] [place], [place] [address]   Phone: [phone]  Fax: [phone]    Sheila Cunningham 14-Jun-1936 MRN: [id number]   _x  _   ORAL DIABETIC MEDICATION INSTRUCTIONS  The day before your procedure:   Take your diabetic pill as you do normally  The day of your procedure:   Do not take your diabetic pill    We will check your blood sugar levels during the admission process and again in Recovery before discharging you home

## 2010-11-20 NOTE — Progress Notes (Signed)
Summary: Rx Clorazepate   Phone Note Refill Request Call back at [phone] Message from:  [facility] on November 21, 2009 8:04 AM  Refills Requested: Medication #1:  CLORAZEPATE DIPOTASSIUM 7.5 MG TABS Take 1 tablet by mouth three times a day Received faxed refill request, please advise   Method Requested: Electronic Initial call taken by: Linde Gillis CMA Duncan Dull),  November 21, 2009 8:06 AM  Follow-up for Phone Call        okay #90 x 1 Follow-up by: Cindee Salt MD,  November 21, 2009 1:51 PM  Additional Follow-up for Phone Call Additional follow up Details #1::        Rx called to pharmacy Additional Follow-up by: DeShannon Katrinka Blazing CMA Duncan Dull),  November 21, 2009 3:24 PM    Prescriptions: CLORAZEPATE DIPOTASSIUM 7.5 MG TABS (CLORAZEPATE DIPOTASSIUM) Take 1 tablet by mouth three times a day  #90 x 1   Entered by:   Mervin Hack CMA (AAMA)   Authorized by:   Cindee Salt MD   Signed by:   Mervin Hack CMA (AAMA) on 11/21/2009   Method used:   Telephoned to ...       [id number]       [address]       [place], [place]  [address]       Ph: [phone] or [phone]       Fax: [phone]   RxID:   [id number]

## 2010-11-20 NOTE — Progress Notes (Signed)
  Phone Note Call from Patient   Caller: Patient Summary of Call: Patient needs an order for her MMG please at [facility], cant gobetween April 15th thru April 29th. Please call patient [phone]. Initial call taken by: Carlton Adam,  January 09, 2010 4:12 PM  Follow-up for Phone Call        order done Follow-up by: Cindee Salt MD,  January 10, 2010 7:45 AM  Additional Follow-up for Phone Call Additional follow up Details #1::        MMG at [facility] on 02/19/2010 at 10:30. Carlton Adam  January 11, 2010 9:57 AM  Additional Follow-up by: Carlton Adam,  January 11, 2010 9:57 AM

## 2010-11-20 NOTE — Progress Notes (Signed)
Summary: pt wants to stop a medication  Phone Note Call from Patient Call back at Home Phone [phone]   Caller: Patient Reason for Call: Talk to Nurse, Talk to Doctor Summary of Call: pt wants to discuss coming off simvastatin and going on something else cause she thinks it makes her tired and weak Initial call taken by: Omer Jack,  April 12, 2010 3:53 PM  Follow-up for Phone Call        Called patient back....she is complaining of feeling tired and weak. Has not seen Dr.Gurman Ashland since 07/2008. Made her an appointment for an office visit on 6/27 with Dr.Jalana Moore.  Follow-up by: Suzan Garibaldi RN  Additional Follow-up for Phone Call Additional follow up Details #1::        it looks like I have not seen patient in several years.  I should not be making judgements on meds unless seen at least yearly in clinic.  Can f/u with primary if does not want to come back Additional Follow-up by: Sherrill Raring, MD, [facility],  April 12, 2010 5:16 PM    Additional Follow-up for Phone Call Additional follow up Details #2::    Will discuss medications at appointment on Monday.

## 2010-11-20 NOTE — Progress Notes (Signed)
Summary: CLORAZEPATE DIPOTASSIUM  Phone Note [facility]ill Request [phone] on September 17, 2010 12:19 PM  Refills Requested: Medication #1:  CLORAZEPATE DIPOTASSIUM 7.5 MG TABS Take 1 tablet by mouth three times a day as needed for anxiety   Last Refilled: 08/16/2010 Form on your desk    Method Requested: Fax to Local Pharmacy Initial call taken by: DeShannon Smith CMA Duncan Dull),  September 17, 2010 12:19 PM  Follow-up for Phone Call        okay #90 x 1 Follow-up by: Cindee Salt MD,  September 17, 2010 1:39 PM  Additional Follow-up for Phone Call Additional follow up Details #1::        Rx faxed to pharmacy Additional Follow-up by: DeShannon Smith CMA Duncan Dull),  September 17, 2010 2:29 PM    Prescriptions: CLORAZEPATE DIPOTASSIUM 7.5 MG TABS (CLORAZEPATE DIPOTASSIUM) Take 1 tablet by mouth three times a day as needed for anxiety  #90 x 1   Entered by:   Mervin Hack CMA (AAMA)   Authorized by:   Cindee Salt MD   Signed by:   Mervin Hack CMA (AAMA) on 09/17/2010   Method used:   Handwritten   RxID:   [id number]

## 2010-11-20 NOTE — Procedures (Signed)
Summary: Nocturnal Polysomnogram,[facility]  Nocturnal [facility]   Imported By: Beau Fanny 08/20/2010 09:46:43  _____________________________________________________________________  External Attachment:    Type:   Image     Comment:   External Document

## 2010-12-11 ENCOUNTER — Telehealth: Payer: Self-pay | Admitting: Internal Medicine

## 2010-12-18 NOTE — Progress Notes (Signed)
Summary: refill request for clorazepate  Phone Note Refill Request Message from:  Fax from Pharmacy  Refills Requested: Medication #1:  CLORAZEPATE DIPOTASSIUM 7.5 MG TABS Take 1 tablet by mouth three times a day as needed for anxiety   Last Refilled: 11/14/2010 Faxed request from [facility] is on your desk.  Initial call taken by: Lowella Petties CMA, AAMA,  December 11, 2010 4:09 PM  Follow-up for Phone Call        Okay #90 x 1 Follow-up by: Cindee Salt MD,  December 12, 2010 8:07 AM  Additional Follow-up for Phone Call Additional follow up Details #1::        Rx faxed to pharmacy Additional Follow-up by: DeShannon Smith CMA Duncan Dull),  December 12, 2010 10:45 AM    Prescriptions: CLORAZEPATE DIPOTASSIUM 7.5 MG TABS (CLORAZEPATE DIPOTASSIUM) Take 1 tablet by mouth three times a day as needed for anxiety  #90 x 1   Entered by:   Mervin Hack CMA (AAMA)   Authorized by:   Cindee Salt MD   Signed by:   Mervin Hack CMA (AAMA) on 12/12/2010   Method used:   Handwritten   RxID:   [id number]

## 2011-01-03 LAB — GLUCOSE, CAPILLARY: Glucose-Capillary: 117 mg/dL — ABNORMAL HIGH (ref 70–99)

## 2011-02-06 ENCOUNTER — Other Ambulatory Visit: Payer: Self-pay | Admitting: Internal Medicine

## 2011-02-07 ENCOUNTER — Other Ambulatory Visit: Payer: Self-pay | Admitting: Internal Medicine

## 2011-02-07 DIAGNOSIS — Z1231 Encounter for screening mammogram for malignant neoplasm of breast: Secondary | ICD-10-CM

## 2011-02-08 ENCOUNTER — Other Ambulatory Visit: Payer: Self-pay | Admitting: *Deleted

## 2011-02-08 DIAGNOSIS — Z1231 Encounter for screening mammogram for malignant neoplasm of breast: Secondary | ICD-10-CM

## 2011-02-08 MED ORDER — CLORAZEPATE DIPOTASSIUM 7.5 MG PO TABS: ORAL_TABLET | ORAL | Status: DC

## 2011-02-08 NOTE — Telephone Encounter (Signed)
Patient is also asking for order for mammogram. She goes to [facility] breast center.

## 2011-02-08 NOTE — Telephone Encounter (Signed)
rx called into pharmacy

## 2011-02-08 NOTE — Telephone Encounter (Signed)
Okay #90 x 1 Order placed for Mammo

## 2011-02-28 ENCOUNTER — Other Ambulatory Visit (INDEPENDENT_AMBULATORY_CARE_PROVIDER_SITE_OTHER): Payer: PRIVATE HEALTH INSURANCE | Admitting: *Deleted

## 2011-02-28 DIAGNOSIS — E785 Hyperlipidemia, unspecified: Secondary | ICD-10-CM

## 2011-03-01 LAB — LIPID PANEL
Cholesterol: 215 mg/dL — ABNORMAL HIGH (ref 0–200)
HDL: 53 mg/dL (ref >39)
LDL Cholesterol: 135 mg/dL — ABNORMAL HIGH (ref 0–99)
Triglycerides: 137 mg/dL (ref <150)

## 2011-03-05 NOTE — Op Note (Signed)
NAME:  Sheila Cunningham, Sheila Cunningham NO.:  [id number]   MEDICAL RECORD NO.:  [id number]          PATIENT TYPE:  INP   LOCATION:  0001                         FACILITY:  [facility]   PHYSICIAN:  Almedia Balls. Ranell Patrick, M.D. DATE OF BIRTH:  17-Jan-1936   DATE OF PROCEDURE:  03/11/2008  DATE OF DISCHARGE:                               OPERATIVE REPORT   PREOPERATIVE DIAGNOSIS:  Right knee end-stage osteoarthritis.   POSTOPERATIVE DIAGNOSIS:  Right knee end-stage osteoarthritis.   PROCEDURE PERFORMED:  Right total knee replacement using DePuy segmental  rotating platform prosthesis.   ATTENDING SURGEON:  Almedia Balls. Ranell Patrick, MD   ASSISTANT:  Konrad Felix Dixon, PA-C   ANESTHESIA:  General anesthesia plus femoral block anesthesia were used.   BLOOD LOSS:  Minimal.   FLUID REPLACEMENT:  1500 mL of crystalloid.   URINE OUT:  250 mL.   TOURNIQUET TIME:  One hour 20 minutes at 350 mmHg.   INSTRUMENT COUNTS:  Correct.   COMPLICATIONS:  There were no complications.   PREOPERATIVE ANTIBIOTICS:  Given.   INDICATIONS:  The patient is a 75 year old female with a history of end-  stage arthritis of the right knee.  The patient had a prior knee  arthroscopy and developed medial femoral condyle osteonecrosis.  She now  has a collapsed medial femoral condyle and incapacitating pain.  The  patient desires operative treatment to restore function and eliminate  pain.  Informed consent was obtained.   DESCRIPTION OF PROCEDURE:  After an adequate level of anesthesia was  achieved, the patient was positioned supine on the operating room table.  The right knee was correctly identified and a nonsterile tourniquet  placed on the proximal thigh.  The right leg was sterilely prepped and  draped in the usual manner.  After exsanguination of the limb using an  Esmarch bandage, we elevated the tourniquet to 350 mmHg.  A longitudinal  midline incision was created with the knee in flexion.  A medial  parapatellar arthrotomy was created a 10 blade scalpel, a fresh 10  blade.  The patella was everted, lateral patellofemoral ligaments  divided and the distal femur was entered using an intramedullary distal  femoral guide to resect 10 mm of bone with 5-degrees right setting.  We  then sized the femur to a size 3 and performed the anterior, posterior  and chamfer cuts with the 4-in-1 cutting guide.  We then resected the  PCL, ACL and remaining meniscal tissues, subluxed the tibia anteriorly  and cut tibia 90 degrees perpendicular to the long axis of the tibia  using an extramedullary guide.  We, at this point, checked our  flexion/extension gaps, which were symmetric.  We then went ahead and  removed our pins and resected the remaining bone on the femur with our  box cut jig for the posterior cruciate substituting prosthesis.  We then  sized the tibia to a size 3 and performed the remaining tibial  preparation with the modular reamer and punch.  We then placed trial  components in place and noted there to be excellent soft tissue  balancing  and full extension with a 12.5 insert.  We then resurfaced the  patella down from a 23-mm down to the 16, replacing 8 mm of thickness  with a 32 button.  At this point, we took the knee through a full range  of motion; excellent patellar tracking was noted with a no-hands  technique.  We went ahead and removed the components, thoroughly  irrigated the knee, plugged the distal femur with available bone and  then cemented the components into place, a size 3 right femur, a size 3  tibia and then used a 12.5 insert.  Once the cement was allowed to  harden, we removed excess cement with a quarter-inch curved osteotome.  We then checked all aspects of the knee for any extra cement.  Once we  were sure there was none noted, we trialed with 12.5, then a 15; the 15  actually fit a little better in flexion and we were able to obtain full  extension.  We thus  selected the real 15 insert and placed that in  place.  We took the knee through a full range of motion; excellent  balance, soft tissue tension and patellar tracking were noted.  We then  closed the knee with #1 PDS suture in a figure-of-eight fashion,  interrupted, for the parapatellar arthrotomy after thorough irrigation.  We then closed the subcutaneous layer with layered Vicryl with 2-0  Vicryl, as well as the skin with a running 4-0 Monocryl.  Steri-Strips  were applied followed by a sterile dressing.  The patient tolerated  surgery well.      Almedia Balls. Ranell Patrick, M.D.  Electronically Signed     SRN/MEDQ  D:  03/11/2008  T:  03/11/2008  Job:  [id number]

## 2011-03-05 NOTE — Letter (Signed)
July 21, 2008    Karie Schwalbe, MD  [address] [place], [place] [address]   RE:  Sheila, Cunningham  MRN:  [id number]  /  DOB:  1936-10-17   Dear Dr. Alphonsus Sias:   This is a letter regarding Sheila Cunningham.  As you know, she is a 75-year-  old woman who had a CVA post surgery in May 2009.  I saw her for the  first time back on August 20.   When I saw her last, her blood pressure was not optimally controlled.  I  added Norvasc to her regimen and recommended followup.   She comes in today, she has stopped it.  Again, she had contacted your  office.  She had some redness of her eyes and her blood pressure was  okay.  It was discontinued.  She continues on her Maxzide daily.   In the interval, she says she is just tired.  She has been tired since  her stroke.  Otherwise, no other complaints.  Breathing is okay.  No  chest pressure.  No new neurologic symptoms.   MEDICATIONS:  1. Aspirin 325.  2. Metformin 500.  3. Zegerid 40.  4. Triamterene.  5. Hydrochlorothiazide.  6. Klor-Con 20.  7. Zocor 40, just started.  8. Astelin nasal spray.   PHYSICAL EXAMINATION:  GENERAL:  She was in no distress.  VITAL SIGNS:  Blood pressure was 130/85 initially, on my check 130/90.  Her pulse was 94 and regular, weight 218.  LUNGS:  Clear.  CARDIAC:  Regular rate and rhythm.  Normal S1 and S2, no S3.  No  significant murmurs.  EXTREMITIES:  No edema.   I recommended that the patient take her blood pressure and keep a log,  bring the cuff in when she comes for her clinic appointment to see you,  and then you will give a better idea of her overall blood pressure  readings.   I placed the patient on Zocor 40 nightly as her lipid panel done in your  office showed an LDL of 141, HDL of 54 with her diabetes, I think  primary prevention is indicated.  I will set the patient to have labs  checked in 8 weeks' time and provide initial followup and then we will  leave her to you for continued  care.   In regards to the patient's CVA and PFO, in consideration for a possible  closure device, I have sent Sheila Cunningham's records to [facility] to  [facility].  I have contacted him, he said he would agree to see the  patient and calling the office today.  She does have an appointment for  next Thursday with him at 1 o'clock.  She will get directions from them.  I told her I would recommend a [facility] institution where volumes are  high and her case can be evaluated fully.   I have not set a definite followup for blood pressure.  You have been  following her cholesterol.  I will provide again initial followup, but  then leave further followup for you.  If you have any questions,  concerns, let me know.  Once I receive the records from Dr. Romeo Apple, I  will forward them to you as well.   Thanks for letting me participate in her care.    Sincerely,      Pricilla Riffle, MD, [facility]  Electronically Signed    PVR/MedQ  DD: 07/21/2008  DT: 07/22/2008  Job #: [id number]

## 2011-03-05 NOTE — Assessment & Plan Note (Signed)
[facility]                            CARDIOLOGY OFFICE NOTE   JAMIRAH, ZELAYA                       MRN:          [id number]  DATE:06/09/2008                            DOB:          [birth year]    IDENTIFICATION:  Ms. Boller is a 75 year old who is referred for  evaluation of PFO/history of CVA.   HISTORY OF PRESENT ILLNESS:  The patient was admitted to [facility]  in May for a knee replacement, postoperatively was complicated by a CVA.  Note, she was followed by the Stroke Service and has been seen by Dr.  Pearlean Brownie since.  She has had blood work done as an outpatient, also  transcranial Doppler.   While she was in the hospital, TEE was done and it was found to have a  patent foramen ovale.  I do not have a full report for this.  No  thrombus was noted.  Lower extremity Dopplers were done also.  Again, I  do not have results for this.   Since this event, the patient recovered almost completely.  She still  has some problems with her left hand with some sensory problems, but  this is improving.   She denies chest pain.  Breathing is fair.  She says since this has all  happened, she has just been tired.   CURRENT MEDICATIONS:  1. Aspirin 325.  2. Metformin 500 b.i.d.  3. Zegerid 40.  4. Triamterene/hydrochlorothiazide 37.5/25.  5. Klor-Con 20.   ALLERGIES:  CODEINE.   PAST MEDICAL HISTORY:  1. Diabetes.  2. Hypertension.  3. History of anxiety/depression.  4. History of GE reflux.  5. History of CVA.  6. History of DJD.  7. History of allergic rhinitis.   SURGICAL HISTORY:  1. Cholecystectomy.  2. Tonsillectomy.  3. Tubal ligation.  4. Vein treatment with laser injections.  5. Right knee meniscus repair in November 2008.  6. TKR in May 2009.   SOCIAL HISTORY:  The patient is divorced, disabled now.  Does not smoke,  quit tobacco in 2007.   REVIEW OF SYSTEMS:  I had seen the patient several years ago actually  with  complaints of chest pain, this was in 2005.  Also associated left  arm numbness, felt to be probable musculoskeletal.  The patient denies  at present.   PHYSICAL EXAMINATION:  GENERAL:  The patient is currently in no acute  distress.  VITAL SIGNS:  Blood pressure is 152/88, pulse is 86, and weight 215.  HEENT:  Normocephalic and atraumatic.  EOMI.  PERRL.  Mucous membranes  are moist.  NECK:  No definite bruits.  JVP is normal.  LUNGS:  Clear without rales or wheezes.  CARDIAC:  Regular rate and rhythm.  S1 and S2.  No S3.  No significant  murmurs.  ABDOMEN:  Supple.  No hepatomegaly.  Normal bowel sounds.  No masses.  EXTREMITIES:  Pulses 2+.  No lower extremity edema.  Feet warm.   A 12-lead EKG shows normal sinus rhythm at 86 beats per minute.  T-wave  inversion in III.  Left axis  deviation.   IMPRESSION:  1. Ms. Ayon is a 75 year old woman with a history of cerebrovascular      accident, post knee surgery and found to have a patent foramen      ovale, currently on aspirin therapy.   I will need to review her blood work and discuss with Dr. Pearlean Brownie her  case.  I also need to review the transesophageal echo.   The patient is very anxious about this.  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  Does not want to have recurrence.   1. Hypertension, not optimally controlled.  I would add Norvasc, low-      dose to her regimen.  Follow up in 4-6 weeks.   I will be in touch with her regarding telephone conversation with Dr.  Pearlean Brownie.     Pricilla Riffle, MD, [facility]  Electronically Signed    PVR/MedQ  DD: 06/10/2008  DT: 06/10/2008  Job #: [id number]   cc:   Karie Schwalbe, MD  Pramod P. Pearlean Brownie, MD

## 2011-03-05 NOTE — Op Note (Signed)
NAMEANDRIKA, Sheila Cunningham NO.:  [id number]   MEDICAL RECORD NO.:  [id number]          PATIENT TYPE:  INP   LOCATION:  5025                         FACILITY:  [facility]   PHYSICIAN:  Almedia Balls. Ranell Patrick, M.D. DATE OF BIRTH:  1936/08/15   DATE OF PROCEDURE:  09/01/2007  DATE OF DISCHARGE:  09/02/2007                               OPERATIVE REPORT   PREOPERATIVE DIAGNOSIS:  Right knee medial meniscus tear.   POSTOPERATIVE DIAGNOSES:  1. Right knee medial meniscus tear.  2. Medial femoral condyle chondromalacia Grade III.   PROCEDURE:  1. Right knee arthroscopy with debridement of medial meniscus tear.  2. Abrasion chondroplasty medial femoral condyle.   SURGEON:  Almedia Balls. Ranell Patrick, M.D.   ASSISTANT:  None.   ANESTHESIA:  General by laryngeal mask.   ESTIMATED BLOOD LOSS:  Minimal.   FLUID REPLACEMENT:  1200 mL crystalloid.   INSTRUMENT COUNT:  Correct.   COMPLICATIONS:  None.   PERIOPERATIVE ANTIBIOTICS:  Given.   INDICATIONS FOR PROCEDURE:  The patient is a 75 year old female with a  history of debilitating right knee pain over the last several weeks  secondary to MRI-proven medial meniscus tear.  The patient presents now  with essentially a locked knee, inability to weight bear on the knee and  debilitating pain.  She has had injections which have not helped her.  She presents now desiring operative treatment to restore function and  eliminate pain.  Informed consent was obtained.   DESCRIPTION OF PROCEDURE:  After an adequate level of anesthesia was  achieved, the patient was positioned supine on the operating room table.  A lateral post was utilized. The right leg was sterilely prepped and  draped in the usual manner.   We entered the knee arthroscopically.  Standard arthroscopic portals  including superolateral outflow, anterolateral scope and anteromedial  working portals were established.  We identified a fairly normal-  appearing patellofemoral  articular cartilage with some mild centralized  Grade II wear in the patella. The trochlea was not warn.  The medial and  lateral gutters were free of loose bodies.  No medial plica was noted.  The medial compartment was entered.  There was a complex posterior horn  medial meniscus tear involving the majority of the meniscus.  We  performed an aggressive partial meniscectomy.  There was a very very  deep radial tear at the junction of the posterior horn and middle  portion of the meniscus, and essentially the full thickness of the  meniscus was involved.  We debrided back to a stable meniscal rim  removing about 50 to 60% of the meniscus. There was also a 1 x 1 cm area  of partial-thickness cartilage loss in the medial femoral condyle.  We  performed a chondroplasty smoothing back to articular cartilage.  All  loose flaps were removed. And fibrillation.  Articular cartilage on the  tibia had some Grade II changes.  Anteriorly the meniscus was intact.  The ACL and PCL were intact.  The lateral compartment was pristine.   Following completion of the partial meniscectomy and abrasion  chondroplasty  we concluded the surgery suturing the wounds with 4-0  Monocryl followed by Steri-Strips and a sterile dressing.  The patient  tolerated the surgery well.      Almedia Balls. Ranell Patrick, M.D.  Electronically Signed     SRN/MEDQ  D:  09/01/2007  T:  09/02/2007  Job:  [id number]

## 2011-03-05 NOTE — H&P (Signed)
NAME:  Sheila Cunningham, Sheila Cunningham NO.:  [id number]   MEDICAL RECORD NO.:  [id number]          PATIENT TYPE:  IPS   LOCATION:  4001                         FACILITY:  [facility]   PHYSICIAN:  Ellwood Dense, M.D.   DATE OF BIRTH:  Feb 28, 1936   DATE OF ADMISSION:  03/18/2008  DATE OF DISCHARGE:                              HISTORY & PHYSICAL   ADMITTING PHYSICIAN:  Ellwood Dense, MD   PRIMARY CARE PHYSICIAN:  Karie Schwalbe, MD   NEUROLOGIST:  Gustavus Messing. Orlin Hilding, MD   CARDIOLOGIST:  Pricilla Riffle, MD, [facility]   HISTORY OF PRESENT ILLNESS:  Ms. Shrieves is a 75 year old Caucasian  female with history of type 2 diabetes mellitus along with hypertension  and end-stage degenerative joint disease of the right knee.   The patient is elected to undergo right total knee replacement by Dr.  Ranell Patrick, Mar 11, 2008.  Postoperatively, she was weightbearing as  tolerated, and on Coumadin for DVT prophylaxis.   On postoperative day #1, the patient was found to be lethargic with left-  sided numbness.  MRI study of the brain showed an acute right middle  cerebral artery infarct with extensive small vessel disease and  moderately premature atrophy.  MRI study of the brain showed moderate  nonstenotic irregularities of the right carotid siphon.  Carotid  Dopplers showed no internal carotid artery stenosis.  A 2-D  echocardiogram was done.  Transesophageal echocardiogram was done to  rule out paradoxical emboli, and it was recommended that the patient to  remain on Coumadin for 1 month postprocedure unless the Dopplers were  positive.  Dopplers have not yet been done, but if the Dopplers are  positive for deep vein thrombosis, then Coumadin will be continued for 6  months.  If the Dopplers were negative, the plan is to increase the  aspirin to 325 mg daily once Coumadin is completed after a month's time.   Transesophageal echocardiogram done on Mar 18, 2008, showed overall  normal left  ventricular ejection fraction with mild right to left shunt  and small patent foramen ovale with no definite thrombus.  Bilateral  lower extremity Dopplers were not yet done.  The patient has  approximately 80 degrees of knee flexion.  She continues with left upper  extremity and left lower extremity sensory loss with minimal weakness.  She transfers with total assistance and is able to ambulate short  distances doing 70% with decreased endurance and proprioception  problems.   The patient was evaluated by the rehabilitation physicians and felt to  be an appropriate candidate for inpatient dilatation.   REVIEW OF SYSTEMS:  Positive for numbness of her left face, left upper  extremity, and left lower extremity; wound healing on the right knee,  and reported weakness.   PAST MEDICAL HISTORY:  1. Type 2 diabetes mellitus.  2. Hypertension.  3. Degenerative joint disease, especially with the right knee.  4. Right knee arthroscopy for meniscal tear in November 2008.  5. Anxiety.  6. Seasonal allergies.  7. Frequency along with urgency and nocturia.   FAMILY HISTORY:  Positive  for hypertension and congestive heart failure.   SOCIAL HISTORY:  The patient lives alone in a one-level home in  [place].  There are three steps to enter.  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  The patient  quit tobacco 2 years ago and denies alcohol usage.  She has sisters and  children who planned to help her at discharge.   FUNCTIONAL HISTORY PRIOR TO ADMISSION:  Independent and active and  working.   ALLERGIES:  CODEINE.   MEDICATIONS PRIOR TO ADMISSION:  1. Allegra 180 mg q.a.m.  2. Advil over the counter p.r.n.  3. Zegerid 40 mg daily.  4. Metformin 500 mg b.i.d.  5. Aspirin 81 mg daily.  6. Darvocet-N 100/650 p.r.n.  7. Triamterene/hydrochlorothiazide 37.5/25 one tablet daily.  8. K-Dur 20 mEq daily.  9. Vitamin D 400 international units  daily.   LABORATORY:  Recent hemoglobin was 12.9 with hematocrit of 38.5,  platelet count of 324,000 and white count of 7.6.  Recent hemoglobin A1c  was elevated slightly at 7.4.  Recent homocystine level was 4.4 with an  INR of 1.4 measured on Mar 18, 2008.  Recent total cholesterol was 128  with HDL cholesterol of 38 and LDL cholesterol of 76 with triglycerides  of 72.  Most recent hemoglobin was 11.4, hematocrit 34.5, platelet count  of 374,000, and white count 9.1.  Most recent sodium is 138, potassium  4.1, chloride 99, bicarbonate 30, BUN 10, and creatinine 0.8 with  glucose of 146.   PHYSICAL EXAMINATION:  Well-appearing, slightly overweight adult female,  lying in bed with CPM machine on her right lower extremity.  Blood pressure is 116/65 with pulse of 89, respiratory rate 20, and  temperature 98.3 with CBGs of 101, 140, and 192.  HEENT:  Normocephalic and nontraumatic.  CARDIOVASCULAR:  Regular rate and rhythm.  S1 and S2 without murmurs.  ABDOMEN:  Soft, slightly obese, and nontender with positive bowel  sounds.  LUNGS:  Clear to auscultation bilaterally.  NEUROLOGIC:  Alert and oriented x3.  Cranial nerve exam showed decreased  sensation in the left face with tongue in the midline and good facial  symmetry.  Right upper extremity strength was 4+/5 with normal bulk and  tone and normal sensation.  Left upper extremity strength was -4/5 with  decreased sensation and decreased coordinating movements.  Left lower  extremity strength was 4 to 4+/5 with decreased sensation and poor  proprioception.  Right lower extremity showed dry wound of the right  knee with dry dressing.   IMPRESSION:  1. Status post probable embolic right middle cerebral artery infarct      with left hemisensory loss.  2. Recent right total knee replacement for degenerative joint disease.   Presently, the patient has deficits in ADLs, transfers, and ambulation  related to the above-noted total knee  replacement along with subsequent  right middle cerebral artery infarct.   PLAN:  1. Admit to the rehabilitation unit for daily therapies to include      physical therapy for range of motion, strengthening, bed mobility,      transfers, pre-gait training, gait training, and equipment      evaluation.  2. Occupational therapy for range of motion, strengthening, ADLs,      cognitive/perceptual training, splinting, and equipment evaluation.  3. Rehab nursing for skin care, wound care, and bowel and bladder      training if necessary.  4. Case management  to assess home environment, assist with discharge      planning, and arrange for appropriate followup care.  5. Social work to assess family and social support and assist in      discharge planning.  6. Continue carbohydrate modified high diet.  7. Check admission labs including CBC and CMET on March 21, 2008.  8. Robaxin 500 mg p.o. q.6 h. p.r.n. for spasms.  9. CBGs a.c. and h.s.  10.Allegra 180 mg p.o. daily.  11.Zegerid 40 mg p.o. daily.  12.Glucophage 500 mg p.o. b.i.d.  13.Enteric-coated aspirin 81 mg p.o. daily.  14.Potassium chloride 20 mEq p.o. daily.  15.Clorazepate 7.5 mg p.o. t.i.d. p.r.n.  16.Vitamin D 400 units p.o. daily.  17.Iron sulfate 325 mg p.o. daily.  18.Triamterene/hydrochlorothiazide 37.5/25 one tablet daily.  19.Simethicone 80 mg p.o. q.4 h. p.r.n.  20.Percocet 5/325 one-half to one tablet p.o. q.3-4 h. p.r.n. for      pain.  21.CPM 0-90 degrees daily on 6 hours in 2-hour increments starting at      5 degrees and progressing to 90 degrees as tolerated.  22.Ice to the right knee p.r.n.  23.Bilateral TED hose on q.a.m. and off nightly.  24.Elevate bilateral lower extremities when in bed/chair.  25.Bilateral lower extremity Dopplers to rule out deep vein      thrombosis, if not already done.  26.Coumadin, per pharmacy.  27.Knee immobilizer in the right lower extremity when asleep.  28.Senokot-S 2 tablets p.o.  nightly, if no bowel movement 24 hours.  29.Lovenox 40 mg subcu daily until INR greater than 2.0.  30.Sliding scale NovoLog insulin for CBGs greater than 151 as ordered.   PROGNOSIS:  Good.   ESTIMATED LENGTH OF STAY:  10 to 12 days.   GOALS:  Modified independent, ADLs, transfers, and ambulation short  distances.           ______________________________  Ellwood Dense, M.D.     DC/MEDQ  D:  03/18/2008  T:  03/19/2008  Job:  [id number]

## 2011-03-05 NOTE — Discharge Summary (Signed)
NAME:  Sheila Cunningham, STODGHILL NO.:  [id number]   MEDICAL RECORD NO.:  [id number]          PATIENT TYPE:  IPS   LOCATION:  4001                         FACILITY:  [facility]   PHYSICIAN:  Ellwood Dense, M.D.   DATE OF BIRTH:  1936/04/25   DATE OF ADMISSION:  03/18/2008  DATE OF DISCHARGE:  03/24/2008                               DISCHARGE SUMMARY   DISCHARGE DIAGNOSES:  1. Embolic right middle cerebral artery infarct.  2. Right total knee replacement.  3. Right knee degenerative joint disease.  4. Diabetes mellitus type 2.  5. Enterococcus urinary tract infection.  6. Hypertension.   HISTORY OF PRESENT ILLNESS:  Ms. Gladwell is a 75 year old female with  history of DM type 2, hypertension, and end-stage DJD of right knee.  She elected to undergo right total knee replacement by Dr. Ranell Patrick on Mar 11, 2008.  Postop was weightbearing as tolerated and Coumadin for DVT  prophylaxis.  On POD 1, the patient was noted to have lethargy with left-  sided numbness.  MRI of brain done showed acute right MCA infarct,  extensive small vessel disease, moderate premature atrophy, MRA of head  showed mild non-stenotic irregularity of right carotids - carotid  Doppler showed no ICA stenosis.  A 2-D echo was done.  __________ was  also done to rule out paradoxical emboli after knee surgery per neuro  input .  PE done showed overall normal left ventricular ejection  fraction, mild right to left shunt, small PA pool, no definite thrombus,  bilateral lower extremity Dopplers were ordered with neuro recommending  Coumadin x6 months if Doppler is positive for DVT and if negative to  increase aspirin to 325 mg once Coumadin completed for DVT prophylaxis.  The patient continues with left upper extremity and left lower extremity  weakness with some numbness and proprioceptive problems.  She is to  __________, noted to have decrease in endurance.  Rehab consulted for  further therapies.   PAST  MEDICAL HISTORY:  Significant for,  1. DM type 2.  2. Hypertension.  3. DVT right knee.  4. Anxiety.  5. Allergies.  6. Right knee arthroscopy for meniscal tear in November 2008.  7. History of frequency, urgency, and nocturia.   ALLERGIES:  Codeine.   FAMILY HISTORY:  Positive for hypertension, DVT.   SOCIAL HISTORY:  The patient lives alone in one-level home with three-  step entry.  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  She  was independent and quite active prior to admission.  She quit tobacco  x2 years.  Does not use any alcohol.  Family present at discharge.   HOSPITAL COURSE:  Ms. Kyeshia Zinn was admitted to rehabilitation on Mar 18, 2008.  The inpatient therapies to consist of PT/OT daily.  Last  admission, she was maintained on Coumadin for DVT prophylaxis during her  stay.  Bilateral lower extremity Doppler was done showed no evidence of  DVT or superficial thrombus.  The patient 's incision  was monitored.  This was noted to be healing well without any signs  or symptoms of  infection.  CPM was used to help with range of motion of right knee.  Labs done past admission revealed hemoglobin 12.4, hematocrit 36.2,  white count 10.2, and platelets 571.  Check of electrolytes revealed  sodium 136, potassium 3.7, chloride 98, CO2 27, BUN 11, creatinine 0.86,  and glucose 139.  UA/UC done showed 100,000 colonies of Enterobacter and  she was treated on Cipro for this.   The patient's pain control was reasonable with p.r.n. use of Percocet.  Therapies were ongoing throughout the stay and the patient has  progressed along well.  She is limited by some safety awareness and some  impulsive behaviors at urgent needs.  Her frustration level overall was  improving.  Problem solving was improving especially with eye-hand  coordination and for focus on use of left upper extremity for functional  use as well as manipulation activities.  The patient was modified   independent with self-care; however, supervision was recommended  secondary to decreasing cognition regarding safety issues.  The patient  was able to ambulate up to 150 feet with a rolling walker.  Supervision  available to navigate 3 steps x2-3 on right side with supervision level,  __________ supervision level.  Right knee active range of motion when  supine was at 95 degrees.  The patient will continue to receive further  followup, home [facility] arranged  for protime draw with [facility] to monitor Coumadin through April 11, 2008.  On March 24, 2008, the patient was discharged to home.   DISCHARGE MEDICATIONS:  1. Cipro 250 mg b.i.d. x5 days.  2. Coumadin 4 mg p.o. per day and continue Coumadin through April 11, 2008.  3. Allegra 180 mg a day.  4. Aspirin 81 mg a day.  5. KCl 20 mEq a day.  6. Vitamin D 400 units daily.  7. Maxzide 25, 37.5  once a day.  8. Metformin 500 mg per day.  9. Percocet 5/325 half to one p.o. q.4 h. p.r.n. pain.   DIET:  Diabetic diet.   Activity level is 24-hour supervision with no alcohol, no strenuous  activity, no driving.  Weightbearing as tolerated with walker.  Special  instructions from health RN to draw protime in 6 days with Coumadin to  continue through April 11, 2008.  Increase aspirin to 325 mg after April 11, 2008, [facility]  for Coumadin with RN.   FOLLOWUP:  The patient to follow up with Dr. Ranell Patrick in 2 weeks for  postop check.  Follow up with Dr. Pearlean Brownie in 1 month.  Follow up with Dr.  Thomasena Edis as needed.      Greg Cutter, P.A.    ______________________________  Ellwood Dense, M.D.   PP/MEDQ  D:  04/05/2008  T:  04/06/2008  Job:  [id number]   cc:   Almedia Balls. Ranell Patrick, M.D.

## 2011-03-05 NOTE — Consult Note (Signed)
Sheila Cunningham, Sheila Cunningham NO.:  [id number]   MEDICAL RECORD NO.:  [id number]          PATIENT TYPE:  INP   LOCATION:  1540                         FACILITY:  [facility]   PHYSICIAN:  Gustavus Messing. Orlin Hilding, M.D.DATE OF BIRTH:  03/16/1936   DATE OF CONSULTATION:  03/12/2008  DATE OF DISCHARGE:                                 CONSULTATION   CHIEF COMPLAINT:  Left-sided numbness a left arm clumsiness.   HISTORY OF PRESENT ILLNESS:  Sheila Cunningham is a 71-year right-handed  white woman with history of hypertension and diabetes but no history of  previous stroke.  She underwent a right total knee replacement yesterday  morning at about 8.  She was groggy after surgery but felt some left  perioral region numbness.  Very early, around 3 this morning, she woke  up and also felt clumsy on the left side as well as more numbness on the  left.  She mentioned this to the orthopedist, Dr. Rennis Chris, who was  rounding this morning, and he was concerned about a possible stroke and  called.  An MRI done this morning shows an acute right MCA infarct but  no critical stenosis of the intracranial vessels on MRA of the head.  She did not have an MRA of the neck done.  She is aware that there are  problems on that side.  She is able to move the leg.  She sometimes has  some mild slurring of her speech, which is more related to her dental  plates whether they are in or out.   REVIEW OF SYSTEMS:  I have a 13-system review.  She had required cane,  walker and crutches because of her knee problems prior to admission.  She was having knee pain.  Had varicose veins.  Denied any specific  cardiac problems such as chest pain or palpitations.  She does have some  dyspnea on exertion, some arthritis, reflux symptoms, constipation.  She  does not have any history of diabetic neuropathy.  She requires reading  glasses, had cataracts.  Has upper and lower dentures.  She has some  snoring, apnea-type symptoms and  does awaken during the night. past.   MEDICAL HISTORY:  1. Diabetes.  2. Hypertension.  3. GERD.  4. Anxiety.  5. Remote tubal ligation.  6. Arthritis of the knee, osteoarthritis and spontaneous OCD to the      medial femoral condyle.   CURRENT MEDICATIONS:  Coumadin.  She has gotten the first dose last  night.  She got Ancef for the surgery.  Aspirin 81 mg daily, vitamin D,  Benadryl, Colace, fentanyl, hydromorphone, NovoLog, Claritin,  Glucophage, Robaxin, morphine, ondansetron, potassium,  triamterene/hydrochlorothiazide and Coumadin.  She also has p.r.n.  orders for Reglan and ondansetron.   ALLERGIES:  CODEINE caused anaphylaxis.   SOCIAL HISTORY:  She has never used alcohol.  Quit smoking a couple of  years ago.  She used to smoke about a pack a day   FAMILY HISTORY:  Family history is positive for hypertension and CHF.   PRIMARY CARE PHYSICIAN:  Karie Schwalbe, MD  She has worked as a Lawyer.   OBJECTIVE:   EXAM:  VITAL SIGNS:  Temperature is 98.5, pulse 94, respirations 18, BP  140/71, 100% saturation on 2 L.  CBG is 145.  HEAD:  Normocephalic, atraumatic.  NECK:  Supple.  No bruit on the left.  She does have a quite high-  pitched bruit on the right.  HEART:  Regular rate and rhythm.  LUNGS:  Clear to auscultation.  Her right leg is in a soft brace following the surgery.  The NIH Stroke Scale:  She is alert.  She answers 2/2 questions  correctly.  She obeys 2/2 commands correctly.  She has normal gaze, no  visual field cut.  No facial palsy.  She has a level I mild drift in the  left upper extremity.  No drift in the right lower extremity.  No drift  in the right upper extremity.  No drift in the left lower extremity.  I  cannot adequately evaluate the right lower extremity since  she  is in a  leg-length stiff soft cast from her recent knee surgery, which was  yesterday.  There is no ataxia.  She definitely has decreased pinprick  in the face, arm and leg on  the left, quite significant.  There is no  aphasia, no definite dysarthria and no extinction.  She scores a 3 on  the stroke scale.   As noted, MRI of the brain shows an acute branch pattern-type stroke,  medium size, in the right MCA branch.  The MRA intracranially shows no  critical stenoses.  Labs are fairly unremarkable.  An INR is 1.1.  She  got one dose of Coumadin yesterday.   IMPRESSION:  Right middle cerebral artery infarct with left hemisensory  loss of face, arm and leg and clumsy left hand.  The MRI appearance is  suggestive of thromboembolic event rather than a small vessel etiology.  She does have a right bruit.   RECOMMENDATIONS:  She is not a candidate for t-PA secondary to having a  recent major surgery and the time elapsed is now 36 hours.  She needs to  have a 2-D echo and carotid Doppler, lipid panel and homocysteine.  Would continue both the Coumadin and the aspirin for now.  She will need  PT and OT to evaluate and treat for stroke-related symptoms in addition  to her orthopedic problems.  Stroke team is not able to follow at [facility].  Please call us back once the echo, carotid Doppler, lipid panel  and homocysteine are all in for follow-up.  I will be gone until March 21, 2008.  Please call my colleagues if you have further questions before  then.      Catherine A. Orlin Hilding, M.D.  Electronically Signed     CAW/MEDQ  D:  03/12/2008  T:  03/12/2008  Job:  [id number]

## 2011-03-05 NOTE — Discharge Summary (Signed)
NAMEBAILLEY, GUILFORD                ACCOUNT NO.:  [id number]   MEDICAL RECORD NO.:  [id number]          PATIENT TYPE:  INP   LOCATION:  5037                         FACILITY:  [facility]   PHYSICIAN:  Pramod P. Pearlean Brownie, MD    DATE OF BIRTH:  July 23, 1936   DATE OF ADMISSION:  03/13/2008  DATE OF DISCHARGE:  03/18/2008                               DISCHARGE SUMMARY   DIAGNOSES AT THE TIME OF DISCHARGE:  1. Right branch middle cerebral artery infarct, status post total knee      replacement, likely embolic; there was no obvious source found.      The patient was found to have a patent foramen ovale with right-to-      left shunt.  This may or may not be related to current stroke.      Will need outpatient follow up.  2. Right total knee replacement by Dr. Ranell Patrick on Mar 11, 2008.  3. Diabetes.  4. Hypertension.  5. Gastroesophageal reflux disease.  6. Anxiety.  7. Tubal ligation.   MEDICINES AT THE TIME OF DISCHARGE:  1. Allegra 180 mg a day  2. Zegerid 40 mg a day.  3. Metformin 500 mg b.i.d.  4. Clorazepate 7.5 mg t.i.d. p.r.n.  5. Aspirin 81 mg a day.  6. Gas-X as needed.  7. Triamcinolone/hydrochlorothiazide 37.5/25 mg a day.  8. Klor-Con 20 mEq a day.  9. Vitamin D 400 international units q.a.m.  10.Percocet 1-2 q.4-6 hours p.r.n.  11.Robaxin 500 mg one q.6 hours p.r.n.  12.Coumadin per pharmacy protocol.   STUDIES PERFORMED:  1. Right knee x-ray shows status post total right knee replacement,      appearing satisfactory position.  2. MRI of the brain shows mild nonstenotic irregularity in the right      carotid siphon, otherwise negative.  Acute right MCA infarct      without significant mass effect or hemorrhage.  Atrophy and small      vessel disease.  3. MRA of the head shows nonstenotic irregularity right carotid      siphon, otherwise negative.  4. A 2-D echocardiogram shows EF of 65% to 75% with no left      ventricular regional wall motion abnormalities.  No embolic  source.      Transesophageal echocardiogram shows left ventricular systolic      functions normal, left atrial appendage was small.  There is an      echo density at the mouth of the appendage that most likely was      artifact from atrial prominence.  No definite thrombosis.  There      was hypertrophy of the intra-atrial septum.  There is a small PFO      as tested by injection of agitated saline with mild right to left      shunt at rest.  5. Carotid Doppler shows no RCA stenosis.  Vertebral artery flow      antegrade.  Transcranial Doppler performed, results pending.  6. Lower extremity Doppler should be performed before transfer to      rehab.  EKG shows  normal sinus rhythm.   LABORATORY STUDIES:  CBC with hemoglobin 11.4, hematocrit 34.5,  otherwise normal.  Chemistry with glucose 146, otherwise normal.  INR on  day of discharge 1.4.  Cardiac enzymes, not performed.  Cholesterol 128,  triglycerides 72, HDL 38, LDL 76, and hemoglobin A1c 7.4.  Homocystine  4.4.   HISTORY OF PRESENT ILLNESS:  Ms. Sheila Cunningham is a 75 year old right-  handed Caucasian female with a history of hypertension and diabetes, but  no history of stroke.  She underwent a right total knee replacement on  Mar 11, 2008, by Dr. Ranell Patrick at about 8 a.m.  She was groggy after  surgery, but felt some left perioral region numbness.  Her family  retroactively remembers her having some left-sided clumsiness.  At 3  a.m., the morning after surgery she woke and felt clumsy on the left  side as well as more numbness on the left.  She mentioned this to the  orthopedist, Dr. Rennis Chris who was rounding, and he was concerned about a  possible stroke and called a code stroke.  An MRI performed shows an  acute right MCA infarct, but no critical stenosis.  She was not a TPA  candidate, secondary to recent surgery.  She was transferred to 3000 for  continuation of stroke workup.   HOSPITAL COURSE:  It was felt her stroke was likely  embolic in etiology.  Though no obvious source was found.  Of note, she was found to have an  incidental patent foramen ovale with right-to-left shunt, which may or  may not be related to the stroke.  She will be asked to follow up in Dr.  Marlis Edelson office for TCD, emboli monitoring, and bubble study, which may  help to define this until then the patient is on Coumadin for her total  knee and will also aid as secondary stroke prevention.  Once Coumadin  was discontinued from the orthopedic standpoint, will resume aspirin at  325 mg a day for stroke prevention.  We will also control risk factors  of diabetes, hypertension, dyslipidemia, and obesity.  PT/OT had  evaluated and felt she does need to go to rehab for a short period prior  to return home.   CONDITION ON DISCHARGE:  The patient was alert and oriented x3.  She has  no aphasia or dysarthria.  Her eye movements are full.  She has no drift  in her upper extremities.  She does have decreased fine motor movement  on the left and decreased coordination on the left.  The right arm  orbits her left arm.  She has mild weakness in the left lower extremity  and her gait is unsteady.  She uses a walker due to her knee.  Her deep  tendon reflexes are 2+.  Her plantars are down x2.  She has decreased  sensation on the left side.   DISCHARGE PLAN:  1. Transfer to rehab for continuation of PT, OT, and speech therapy.  2. Coumadin for secondary stroke prevention.  Once completed from      orthopedic standpoint, we will change to aspirin 325 mg a day.   FOLLOWUP:  1. Follow up with primary care physician within 1 month of discharge      from rehab.  2. Follow up at Dr. Micki Riley office within 1 month after      discharge for an outpatient TCD, bubble study, and emboli      monitoring.  The patient to call and  arrange.  3. Ongoing risk factor, controlled by primary care physician.      Annie Main, N.P.     ______________________________  Sunny Schlein. Pearlean Brownie, MD    SB/MEDQ  D:  03/18/2008  T:  03/19/2008  Job:  [id number]   cc:   Pramod P. Pearlean Brownie, MD  Almedia Balls Ranell Patrick, M.D.  Karie Schwalbe, MD

## 2011-03-05 NOTE — H&P (Signed)
NAME:  Sheila Cunningham, MICALE NO.:  [id number]   MEDICAL RECORD NO.:  [id number]         PATIENT TYPE:  LINP   LOCATION:                               FACILITY:  [facility]   PHYSICIAN:  Almedia Balls. Ranell Patrick, M.D. DATE OF BIRTH:  1936-05-30   DATE OF ADMISSION:  03/11/2008  DATE OF DISCHARGE:                              HISTORY & PHYSICAL   CHIEF COMPLAINT:  Right knee pain.   HISTORY OF PRESENT ILLNESS:  The patient is a 75 year old female with  worsening right knee pain secondary to osteoarthritis and spontaneous  OCD to the medial femoral condyle.  The patient has elected to have a  total knee arthroplasty.   PAST MEDICAL HISTORY:  1. Diabetes.  2. Hypertension.  3. Anxiety.  4. GERD.   FAMILY HISTORY:  1. Hypertension.  2. Congestive heart failure.   SOCIAL HISTORY:  The patient does not smoke or use alcohol.  No illicit  drugs.  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   ALLERGIES:  CODEINE.   CURRENT MEDICATIONS:  1. Trampoline hydrochlorothiazide 37.5/25 1 tablet p.o. daily.  2. Klor-Con 20 mEq daily.  3. Nasonex 50 mcg 2 sprays daily.  4. Aspirin 81 mg p.o. daily.  5. Metformin 500 mg p.o. t.i.d.  6. Zegerid one tablet p.o. daily.  7. Darvocet-N 100, 1-2 tablets q.4-6h. p.r.n. pain.  8. Astelin p.r.n.  9. Tranxene 7.5 t.i.d.  10.Allegra 240 mg p.o. daily p.r.n.   REVIEW OF SYSTEMS:  Pain with ambulation.   PHYSICAL EXAMINATION:  VITAL SIGNS:  Pulse 86, respirations 16, blood  pressure 148/76.  GENERAL:  The patient is a healthy-appearing 75 year old female in no  acute distress.  Pleasant mood and affect.  Oriented x3.  HEAD/NECK:  Cranial nerves II-XII grossly intact.  She has full range of  motion of the cervical and thoracic spines.  LUNGS:  Active breath sounds bilaterally with no wheezes, rhonchi or  rales.  HEART:  Regular rate and rhythm with no murmur.  ABDOMEN:  Nontender, nondistended with active bowel sounds.  EXTREMITIES:  Moderate tenderness at the right knee.  She does have an  antalgic gait.  Neurovascularly she is intact.  The majority of her  tenderness is along the medial joint line.  NEUROLOGIC:  She is intact in bilateral upper and lower extremities.  She has no rashes or edema.   X-RAYS:  End-stage osteoarthritis of the right knee with an OCD.   IMPRESSION:  Right knee end-stage osteoarthritis.   PLAN:  The plan is to have a right total knee arthroplasty by Dr. Malon Kindle.      Thomas B. Durwin Nora, P.A.      Almedia Balls. Ranell Patrick, M.D.  Electronically Signed    TBD/MEDQ  D:  02/18/2008  T:  02/18/2008  Job:  [id number]

## 2011-03-19 ENCOUNTER — Ambulatory Visit: Payer: Self-pay | Admitting: Internal Medicine

## 2011-03-19 LAB — HM MAMMOGRAPHY

## 2011-03-21 ENCOUNTER — Encounter: Payer: Self-pay | Admitting: Internal Medicine

## 2011-03-22 ENCOUNTER — Ambulatory Visit (INDEPENDENT_AMBULATORY_CARE_PROVIDER_SITE_OTHER): Payer: PRIVATE HEALTH INSURANCE | Admitting: Internal Medicine

## 2011-03-22 ENCOUNTER — Encounter: Payer: Self-pay | Admitting: Internal Medicine

## 2011-03-22 VITALS — BP 120/70 | HR 88 | Temp 98.4°F | Ht 65.0 in | Wt 220.0 lb

## 2011-03-22 DIAGNOSIS — E039 Hypothyroidism, unspecified: Secondary | ICD-10-CM

## 2011-03-22 DIAGNOSIS — E119 Type 2 diabetes mellitus without complications: Secondary | ICD-10-CM

## 2011-03-22 DIAGNOSIS — E785 Hyperlipidemia, unspecified: Secondary | ICD-10-CM

## 2011-03-22 DIAGNOSIS — G479 Sleep disorder, unspecified: Secondary | ICD-10-CM

## 2011-03-22 DIAGNOSIS — F411 Generalized anxiety disorder: Secondary | ICD-10-CM

## 2011-03-22 LAB — HEPATIC FUNCTION PANEL
ALT: 18 U/L (ref 0–35)
AST: 25 U/L (ref 0–37)
Albumin: 4 g/dL (ref 3.5–5.2)
Alkaline Phosphatase: 89 U/L (ref 39–117)
Total Bilirubin: 0.5 mg/dL (ref 0.3–1.2)

## 2011-03-22 LAB — BASIC METABOLIC PANEL
Calcium: 9.5 mg/dL (ref 8.4–10.5)
GFR: 67.92 mL/min (ref >60.00)
Potassium: 3.9 mEq/L (ref 3.5–5.1)
Sodium: 138 mEq/L (ref 135–145)

## 2011-03-22 LAB — CBC WITH DIFFERENTIAL/PLATELET
Basophils Absolute: 0.1 10*3/uL (ref 0.0–0.1)
Eosinophils Absolute: 0.2 10*3/uL (ref 0.0–0.7)
Eosinophils Relative: 2.7 % (ref 0.0–5.0)
HCT: 40 % (ref 36.0–46.0)
Lymphs Abs: 3.3 10*3/uL (ref 0.7–4.0)
MCV: 88.6 fl (ref 78.0–100.0)
Monocytes Absolute: 0.7 10*3/uL (ref 0.1–1.0)
Neutrophils Relative %: 49.5 % (ref 43.0–77.0)
Platelets: 295 10*3/uL (ref 150.0–400.0)
RDW: 13.9 % (ref 11.5–14.6)
WBC: 8.6 10*3/uL (ref 4.5–10.5)

## 2011-03-22 LAB — LIPID PANEL
Cholesterol: 130 mg/dL (ref 0–200)
HDL: 53.9 mg/dL (ref >39.00)
VLDL: 22.4 mg/dL (ref 0.0–40.0)

## 2011-03-22 LAB — TSH: TSH: 3.51 u[IU]/mL (ref 0.35–5.50)

## 2011-03-22 LAB — HEMOGLOBIN A1C: Hgb A1c MFr Bld: 7.5 % — ABNORMAL HIGH (ref 4.6–6.5)

## 2011-03-22 LAB — T4, FREE: Free T4: 0.81 ng/dL (ref 0.60–1.60)

## 2011-03-22 NOTE — Assessment & Plan Note (Signed)
Doing better now 

## 2011-03-22 NOTE — Assessment & Plan Note (Signed)
Back on zocor Will check labs back on med (only 2 weeks but may be adequate time and save her another appt)

## 2011-03-22 NOTE — Assessment & Plan Note (Signed)
Seems to have good control Discussed cutting back on fingersticks Discussed fitness  Lab Results  Component Value Date   HGBA1C 7.4* 09/17/2010

## 2011-03-22 NOTE — Assessment & Plan Note (Signed)
Chronic Plans to try melatonin

## 2011-03-22 NOTE — Progress Notes (Signed)
Subjective:    Patient ID: Sheila Cunningham, female    DOB: [birth year], 75 y.o.   MRN: [id number]  HPI Doing okay in general  zocor restarted at 40mg  daily Cholesterol was high and no change in chronic aching when she was off the meds  Checks sugars every morning--discussed reducing frequency Has been more careful with eating occ mild hypoglycemic symtpoms  fasting generally under 120  Mood has generally been good No ongoing depression or anxiety Chronic sleep problems---up every 2-3 hours.  Plans to try melatonin  Current outpatient prescriptions:aspirin 325 MG tablet, Take 325 mg by mouth daily.  , Disp: , Rfl: ;  clorazepate (TRANXENE) 7.5 MG tablet, Take 1 tablet by mouth 3 times a day as needed for anxiety., Disp: 90 tablet, Rfl: 1;  cyclobenzaprine (FLEXERIL) 5 MG tablet, Take 5 mg by mouth daily as needed.  , Disp: , Rfl: ;  glucose blood test strip, Patient tests once daily dx: 250.00 , Disp: , Rfl:  KLOR-CON M20 20 MEQ tablet, TAKE 2 TABLETS BY MOUTH DAILY, Disp: 60 tablet, Rfl: 1;  levothyroxine (SYNTHROID, LEVOTHROID) 25 MCG tablet, Take 25 mcg by mouth daily.  , Disp: , Rfl: ;  loratadine (CLARITIN) 10 MG tablet, Take 10 mg by mouth daily.  , Disp: , Rfl: ;  metFORMIN (GLUCOPHAGE) 500 MG tablet, Take 500 mg by mouth 2 (two) times daily with a meal.  , Disp: , Rfl:  mometasone (NASONEX) 50 MCG/ACT nasal spray, Place 2 sprays into the nose daily.  , Disp: , Rfl: ;  Multiple Vitamin (MULTIVITAMIN) capsule, Take 1 capsule by mouth daily.  , Disp: , Rfl: ;  omeprazole (PRILOSEC) 20 MG capsule, Take 20 mg by mouth daily.  , Disp: , Rfl: ;  simvastatin (ZOCOR) 40 MG tablet, Take 40 mg by mouth at bedtime.  , Disp: , Rfl:  triamterene-hydrochlorothiazide (MAXZIDE-25) 37.5-25 MG per tablet, Take 1 tablet by mouth daily.  , Disp: , Rfl: ;  vitamin B-12 (CYANOCOBALAMIN) 1000 MCG tablet, Take 1,000 mcg by mouth daily.  , Disp: , Rfl:   Past Medical History  Diagnosis Date  . Allergy   .  Anxiety   . Depression   . Diabetes mellitus   . GERD (gastroesophageal reflux disease)   . Hypertension   . Hyperlipidemia   . Thyroid disease   . Stroke     Past Surgical History  Procedure Date  . Cholecystectomy   . Tonsillectomy   . Vaginal delivery     x5  . Tubal ligation   . Endoscopic vein laser treatment 2006  . Meniscus repair 11/08    right knee  . Total knee arthroplasty 05/09    after CVA  . Cataract extraction     left eye    Family History  Problem Relation Age of Onset  . Coronary artery disease Mother   . Breast cancer Mother   . Breast cancer Sister   . Breast cancer Maternal Aunt   . Breast cancer Maternal Grandmother     History   Social History  . Marital Status: Single    Spouse Name: N/A    Number of Children: 5  . Years of Education: N/A   Occupational History  . DISABLED    Social History Main Topics  . Smoking status: Former Smoker    Types: Cigarettes    Quit date: 10/21/2005  . Smokeless tobacco: Not on file  . Alcohol Use: No  . Drug Use: No  .  Sexually Active: Not on file   Other Topics Concern  . Not on file   Social History Narrative  . No narrative on file   Review of Systems Recent cryotherapy for apparent actinics on right cheek and left breast area Weight has gone up ~10# since last visit despite care with eating No exercise    Objective:   Physical Exam  Constitutional: She appears well-developed and well-nourished. No distress.  Neck: Normal range of motion. Neck supple.  Cardiovascular: Normal rate, regular rhythm, normal heart sounds and intact distal pulses.  Exam reveals no gallop.   No murmur heard. Pulmonary/Chest: Effort normal and breath sounds normal. No respiratory distress. She has no wheezes. She has no rales.  Musculoskeletal: Normal range of motion. She exhibits no edema and no tenderness.  Lymphadenopathy:    She has no cervical adenopathy.  Neurological:       Normal sensation on  plantar feet  Skin: Skin is warm. No rash noted.  Psychiatric: She has a normal mood and affect. Her behavior is normal. Judgment and thought content normal.          Assessment & Plan:

## 2011-03-22 NOTE — Assessment & Plan Note (Signed)
Due for labs

## 2011-03-25 ENCOUNTER — Encounter: Payer: Self-pay | Admitting: *Deleted

## 2011-03-25 ENCOUNTER — Encounter: Payer: Self-pay | Admitting: Internal Medicine

## 2011-04-08 ENCOUNTER — Other Ambulatory Visit: Payer: Self-pay | Admitting: *Deleted

## 2011-04-08 MED ORDER — POTASSIUM CHLORIDE CRYS ER 20 MEQ PO TBCR: EXTENDED_RELEASE_TABLET | ORAL | Status: DC

## 2011-04-08 NOTE — Telephone Encounter (Signed)
Faxed request from [facility] is on your desk  

## 2011-04-08 NOTE — Telephone Encounter (Signed)
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

## 2011-04-09 MED ORDER — CLORAZEPATE DIPOTASSIUM 7.5 MG PO TABS: ORAL_TABLET | ORAL | Status: DC

## 2011-04-09 MED ORDER — TRIAMTERENE-HCTZ 37.5-25 MG PO TABS: 1.0000 | ORAL_TABLET | Freq: Every day | ORAL | Status: DC

## 2011-04-09 NOTE — Telephone Encounter (Signed)
rx faxed to pharmacy manually  

## 2011-05-15 ENCOUNTER — Encounter: Payer: Self-pay | Admitting: Internal Medicine

## 2011-06-05 ENCOUNTER — Other Ambulatory Visit: Payer: Self-pay | Admitting: Internal Medicine

## 2011-06-05 ENCOUNTER — Telehealth: Payer: Self-pay | Admitting: *Deleted

## 2011-06-05 NOTE — Telephone Encounter (Signed)
Form on your desk  

## 2011-06-06 MED ORDER — CLORAZEPATE DIPOTASSIUM 7.5 MG PO TABS: ORAL_TABLET | ORAL | Status: DC

## 2011-06-06 NOTE — Telephone Encounter (Signed)
Rx Already sent!

## 2011-06-06 NOTE — Telephone Encounter (Signed)
Already sent Rx

## 2011-06-06 NOTE — Telephone Encounter (Signed)
Okay #90 x 1 

## 2011-06-06 NOTE — Telephone Encounter (Signed)
rx faxed to pharmacy manually  

## 2011-06-17 ENCOUNTER — Encounter: Payer: Self-pay | Admitting: Internal Medicine

## 2011-06-17 ENCOUNTER — Ambulatory Visit (INDEPENDENT_AMBULATORY_CARE_PROVIDER_SITE_OTHER): Payer: PRIVATE HEALTH INSURANCE | Admitting: Internal Medicine

## 2011-06-17 DIAGNOSIS — I10 Essential (primary) hypertension: Secondary | ICD-10-CM

## 2011-06-17 DIAGNOSIS — E785 Hyperlipidemia, unspecified: Secondary | ICD-10-CM

## 2011-06-17 DIAGNOSIS — Q211 Atrial septal defect: Secondary | ICD-10-CM

## 2011-06-17 MED ORDER — SIMVASTATIN 40 MG PO TABS: 40.0000 mg | ORAL_TABLET | Freq: Every day | ORAL | Status: DC

## 2011-06-17 NOTE — Patient Instructions (Signed)
Your physician wants you to follow-up in:  12 months.  You will receive a reminder letter in the mail two months in advance. If you don't receive a letter, please call our office to schedule the follow-up appointment.   

## 2011-06-17 NOTE — Assessment & Plan Note (Signed)
Asymtomatic.  Continue ASA.

## 2011-06-17 NOTE — Assessment & Plan Note (Signed)
BP is a little high  140/94.    Says it is high.  Asked her to follow at home   Call if high. Take cuff when sees dr. Alphonsus Sias

## 2011-06-17 NOTE — Assessment & Plan Note (Signed)
I have aske her to decreased Zocor to 20.  Will see dr. Alphonsus Sias in December. Have lipids then

## 2011-06-17 NOTE — Progress Notes (Signed)
HPI Patient is a 75 year old women. I saw her back in 2009 when she was sent for evlauation of abnormal TEE. the patient had a stroke after knee surgery. Work up included a TEE that was suspicious for a PFO. She was referred to [facility] for possible device closure. She was seen by K. Romeo Apple who reviewed studies and felt the patient had a septal fenestratin and not a PFO. He recommended medical therapy.  She comes in today complaining of fatigue, lack of energy. She just doesn't feel like doing things. when she does things like mowing the lawn she is getting more short of breath this has gotten worse over the past year. Denies chest pains.  Complains of weakiness still in L hand and L leg.  Occasional achiness in lower legs  Concerned that Zocor is making her feel bad. She went ahead and had a stress test   This was normal.  She also had a sleep study that was neg for apnea.  She has been seen by Maudry Diego since. Lipids in June showed LDL in the 50s.  Not sleeping well.   Says she is probably bored. No chest pains..  No new neurologic problems  Allergies  Allergen Reactions  . Ace Inhibitors     REACTION: unspecified  . Alprazolam     REACTION: unspecified  . Amlodipine Besylate     REACTION: puffy eyes and hands  . Bupropion Hcl     REACTION: At high dose: Ill, hateful, sleepy  . Citalopram Hydrobromide     REACTION: unspecified  . Codeine Phosphate     REACTION: unspecified  . Fluoxetine Hcl     REACTION: "too strong"    Current Outpatient Prescriptions  Medication Sig Dispense Refill  . aspirin 325 MG tablet Take 325 mg by mouth daily.        . clorazepate (TRANXENE) 7.5 MG tablet Take 1 tablet by mouth 3 times a day as needed for anxiety.  90 tablet  1  . cyclobenzaprine (FLEXERIL) 5 MG tablet Take 5 mg by mouth daily as needed.        Marland Kitchen glucose blood test strip Patient tests once daily dx: 250.00       . levothyroxine (SYNTHROID, LEVOTHROID) 25 MCG tablet Take 25 mcg by mouth  daily.        Marland Kitchen loratadine (CLARITIN) 10 MG tablet Take 10 mg by mouth daily.        . metFORMIN (GLUCOPHAGE) 500 MG tablet Take 500 mg by mouth 2 (two) times daily with a meal.        . mometasone (NASONEX) 50 MCG/ACT nasal spray Place 2 sprays into the nose as needed.       . Multiple Vitamin (MULTIVITAMIN) capsule Take 1 capsule by mouth daily.        Marland Kitchen omeprazole (PRILOSEC) 20 MG capsule TAKE ONE CAPSULE BY MOUTH ONCE DAILY  30 capsule  6  . potassium chloride SA (KLOR-CON M20) 20 MEQ tablet Take 2 tablets by mouth daily  60 tablet  6  . simvastatin (ZOCOR) 40 MG tablet Take 40 mg by mouth at bedtime.        . triamterene-hydrochlorothiazide (MAXZIDE-25) 37.5-25 MG per tablet Take 1 tablet by mouth daily.  90 tablet  3  . vitamin B-12 (CYANOCOBALAMIN) 1000 MCG tablet Take 1,000 mcg by mouth daily.          Past Medical History  Diagnosis Date  . Allergy   .  Anxiety   . Depression   . Diabetes mellitus   . GERD (gastroesophageal reflux disease)   . Hypertension   . Hyperlipidemia   . Thyroid disease   . Stroke     Past Surgical History  Procedure Date  . Cholecystectomy   . Tonsillectomy   . Vaginal delivery     x5  . Tubal ligation   . Endoscopic vein laser treatment 2006  . Meniscus repair 11/08    right knee  . Total knee arthroplasty 05/09    after CVA  . Cataract extraction     left eye    Family History  Problem Relation Age of Onset  . Coronary artery disease Mother   . Breast cancer Mother   . Cancer Mother     breast cancer, ovarian cancer  . Heart failure Mother   . Breast cancer Sister   . Cancer Sister     breast cancer  . Breast cancer Maternal Aunt   . Cancer Maternal Aunt     breast cancer  . Breast cancer Maternal Grandmother   . Cancer Maternal Grandmother     breast cancer    History   Social History  . Marital Status: Single    Spouse Name: N/A    Number of Children: 5  . Years of Education: N/A   Occupational History  .  DISABLED    Social History Main Topics  . Smoking status: Former Smoker    Types: Cigarettes    Quit date: 10/21/2005  . Smokeless tobacco: Not on file  . Alcohol Use: No  . Drug Use: No  . Sexually Active: Not on file   Other Topics Concern  . Not on file   Social History Narrative  . No narrative on file    Review of Systems:  All systems reviewed.  They are negative to the above problem except as previously stated.  Vital Signs: BP 132/94  Pulse 100  Ht 5' 4.5" (1.638 m)  Wt 217 lb 12.8 oz (98.793 kg)  BMI 36.81 kg/m2  Physical Exam Patient in NAD   HEENT:  Normocephalic, atraumatic. EOMI, PERRLA.  Neck: JVP is normal. No thyromegaly. No bruits.  Lungs: clear to auscultation. No rales no wheezes.  Heart: Regular rate and rhythm. Normal S1, S2. No S3.   No significant murmurs. PMI not displaced.  Abdomen:  Supple, nontender. Normal bowel sounds. No masses. No hepatomegaly.  Extremities:   Good distal pulses throughout. No lower extremity edema.  Musculoskeletal :moving all extremities.  Neuro:   alert and oriented x3.  CN II-XII grossly intact.  Sinus rhythm.  100 bpm.   Assessment and Plan:

## 2011-07-06 ENCOUNTER — Other Ambulatory Visit: Payer: Self-pay | Admitting: Internal Medicine

## 2011-07-17 LAB — CBC
HCT: 29.4 — ABNORMAL LOW
Hemoglobin: 10.2 — ABNORMAL LOW
MCHC: 33.1
MCHC: 33.2
MCV: 86.5
MCV: 87.9
Platelets: 245
Platelets: 283
Platelets: 374
RDW: 14.2
RDW: 14.3
RDW: 14.4

## 2011-07-17 LAB — BASIC METABOLIC PANEL
BUN: 10
BUN: 11
BUN: 6
CO2: 29
CO2: 30
Calcium: 8.2 — ABNORMAL LOW
Chloride: 104
Chloride: 99
Creatinine, Ser: 0.77
Creatinine, Ser: 0.8
GFR calc non Af Amer: >60
GFR calc non Af Amer: >60
Glucose, Bld: 132 — ABNORMAL HIGH
Glucose, Bld: 146 — ABNORMAL HIGH
Glucose, Bld: 95
Potassium: 3.7
Sodium: 137

## 2011-07-17 LAB — PROTIME-INR
INR: 1.1
INR: 1.4
INR: 1.6 — ABNORMAL HIGH
INR: 2.2 — ABNORMAL HIGH
Prothrombin Time: 16.4 — ABNORMAL HIGH
Prothrombin Time: 17.7 — ABNORMAL HIGH
Prothrombin Time: 19.3 — ABNORMAL HIGH
Prothrombin Time: 19.4 — ABNORMAL HIGH

## 2011-07-17 LAB — HEMOGLOBIN A1C: Hgb A1c MFr Bld: 7.4 — ABNORMAL HIGH

## 2011-07-17 LAB — URINE MICROSCOPIC-ADD ON

## 2011-07-17 LAB — URINALYSIS, ROUTINE W REFLEX MICROSCOPIC
Bilirubin Urine: NEGATIVE
Glucose, UA: NEGATIVE
Specific Gravity, Urine: 1.022
Urobilinogen, UA: 1

## 2011-07-17 LAB — URINE CULTURE: Special Requests: NEGATIVE

## 2011-07-17 LAB — LIPID PANEL
Total CHOL/HDL Ratio: 3.4
VLDL: 14

## 2011-07-17 LAB — TYPE AND SCREEN
ABO/RH(D): O POS
Antibody Screen: NEGATIVE

## 2011-07-18 LAB — CBC
Hemoglobin: 12.4
MCHC: 34.4
RBC: 4.13

## 2011-07-18 LAB — PROTIME-INR
INR: 1.8 — ABNORMAL HIGH
Prothrombin Time: 21.3 — ABNORMAL HIGH

## 2011-07-18 LAB — DIFFERENTIAL
Basophils Absolute: 0.1
Basophils Relative: 1
Eosinophils Absolute: 0.5
Lymphs Abs: 2.1
Neutrophils Relative %: 64

## 2011-07-18 LAB — COMPREHENSIVE METABOLIC PANEL
ALT: 18
Alkaline Phosphatase: 127 — ABNORMAL HIGH
CO2: 27
Calcium: 9.6
GFR calc non Af Amer: >60
Glucose, Bld: 139 — ABNORMAL HIGH
Sodium: 136

## 2011-07-30 LAB — BASIC METABOLIC PANEL
BUN: 20
CO2: 30
Chloride: 100
Chloride: 102
GFR calc Af Amer: >60
Glucose, Bld: 143 — ABNORMAL HIGH
Potassium: 3.8
Potassium: 3.9
Sodium: 136
Sodium: 137

## 2011-07-30 LAB — URINALYSIS, ROUTINE W REFLEX MICROSCOPIC
Bilirubin Urine: NEGATIVE
Glucose, UA: NEGATIVE
Hgb urine dipstick: NEGATIVE
Ketones, ur: NEGATIVE
Specific Gravity, Urine: 1.01
pH: 6

## 2011-07-30 LAB — CBC
Hemoglobin: 13.8
MCHC: 33.3
MCV: 87.7
RBC: 4.72

## 2011-07-30 LAB — HEMOGLOBIN AND HEMATOCRIT, BLOOD: HCT: 33.9 — ABNORMAL LOW

## 2011-08-06 ENCOUNTER — Other Ambulatory Visit: Payer: Self-pay | Admitting: *Deleted

## 2011-08-06 ENCOUNTER — Emergency Department: Payer: Self-pay | Admitting: Emergency Medicine

## 2011-08-06 MED ORDER — CLORAZEPATE DIPOTASSIUM 7.5 MG PO TABS: ORAL_TABLET | ORAL | Status: DC

## 2011-08-06 NOTE — Telephone Encounter (Signed)
rx called into pharmacy

## 2011-08-06 NOTE — Telephone Encounter (Signed)
Okay #90 x 1 

## 2011-08-06 NOTE — Telephone Encounter (Signed)
Phoned request from patient, please send to [facility].

## 2011-09-23 ENCOUNTER — Encounter: Payer: Self-pay | Admitting: Internal Medicine

## 2011-09-23 ENCOUNTER — Ambulatory Visit (INDEPENDENT_AMBULATORY_CARE_PROVIDER_SITE_OTHER): Payer: PRIVATE HEALTH INSURANCE | Admitting: Internal Medicine

## 2011-09-23 VITALS — BP 110/77 | HR 99 | Temp 97.9°F | Ht 64.0 in | Wt 218.0 lb

## 2011-09-23 DIAGNOSIS — I10 Essential (primary) hypertension: Secondary | ICD-10-CM

## 2011-09-23 DIAGNOSIS — E119 Type 2 diabetes mellitus without complications: Secondary | ICD-10-CM

## 2011-09-23 DIAGNOSIS — F329 Major depressive disorder, single episode, unspecified: Secondary | ICD-10-CM

## 2011-09-23 DIAGNOSIS — J019 Acute sinusitis, unspecified: Secondary | ICD-10-CM

## 2011-09-23 DIAGNOSIS — E785 Hyperlipidemia, unspecified: Secondary | ICD-10-CM

## 2011-09-23 LAB — LIPID PANEL
HDL: 60.8 mg/dL (ref >39.00)
Total CHOL/HDL Ratio: 3

## 2011-09-23 MED ORDER — AMOXICILLIN 500 MG PO TABS: 1000.0000 mg | ORAL_TABLET | Freq: Two times a day (BID) | ORAL | Status: AC

## 2011-09-23 NOTE — Assessment & Plan Note (Signed)
Lab Results  Component Value Date   HGBA1C 7.5* 03/22/2011   Seems to still have acceptable control Will recheck Doing more exercise

## 2011-09-23 NOTE — Assessment & Plan Note (Signed)
BP Readings from Last 3 Encounters:  09/23/11 110/77  06/17/11 132/94  03/22/11 120/70   Good control No changes needed

## 2011-09-23 NOTE — Assessment & Plan Note (Signed)
Mood generally okay Some stress with sister Pam having severe CHF Continue the med

## 2011-09-23 NOTE — Progress Notes (Signed)
Subjective:    Patient ID: Sheila Cunningham, female    DOB: [birth year], 75 y.o.   MRN: [id number]  HPI Doing fairly well except for sinus infection Symptoms started 2 weeks ago No fever Lots of drainage, causing cough----lots of yellow mucus at night Sore throat Some ear pain No headaches Using the loratadine and astelin--this helps when out raking leaves  Dr Tenny Craw cut the simvastatin Down to 20mg  daily  [facility] Usually 120-130/80 or so No chest pain No SOB No edema  Checks sugars 3-4 times per week Usually 125 or so No hypoglycemic spells but it does go low if she exercises Has been [facility]---has increased her activity level  Mood has been fine No exacerbation of depression Not excited about Christmas though  Current Outpatient Prescriptions on File Prior to Visit  Medication Sig Dispense Refill  . aspirin 325 MG tablet Take 325 mg by mouth daily.        . clorazepate (TRANXENE) 7.5 MG tablet Take 1 tablet by mouth 3 times a day as needed for anxiety.  90 tablet  1  . cyclobenzaprine (FLEXERIL) 5 MG tablet Take 5 mg by mouth daily as needed.        Marland Kitchen glucose blood test strip Patient tests once daily dx: 250.00       . levothyroxine (SYNTHROID, LEVOTHROID) 25 MCG tablet TAKE 1 TABLET BY MOUTH ONCE DAILY  30 tablet  11  . loratadine (CLARITIN) 10 MG tablet Take 10 mg by mouth daily.        . metFORMIN (GLUCOPHAGE) 500 MG tablet Take 500 mg by mouth 2 (two) times daily with a meal.        . Multiple Vitamin (MULTIVITAMIN) capsule Take 1 capsule by mouth daily.        Marland Kitchen omeprazole (PRILOSEC) 20 MG capsule TAKE ONE CAPSULE BY MOUTH ONCE DAILY  30 capsule  6  . potassium chloride SA (KLOR-CON M20) 20 MEQ tablet Take 2 tablets by mouth daily  60 tablet  6  . simvastatin (ZOCOR) 40 MG tablet Take 1 tablet (40 mg total) by mouth at bedtime.  30 tablet  6  . triamterene-hydrochlorothiazide (MAXZIDE-25) 37.5-25 MG per tablet Take 1  tablet by mouth daily.  90 tablet  3  . vitamin B-12 (CYANOCOBALAMIN) 1000 MCG tablet Take 1,000 mcg by mouth daily.          Allergies  Allergen Reactions  . Ace Inhibitors     REACTION: unspecified  . Alprazolam     REACTION: unspecified  . Amlodipine Besylate     REACTION: puffy eyes and hands  . Bupropion Hcl     REACTION: At high dose: Ill, hateful, sleepy  . Citalopram Hydrobromide     REACTION: unspecified  . Codeine Phosphate     REACTION: unspecified  . Fluoxetine Hcl     REACTION: "too strong"    Past Medical History  Diagnosis Date  . Allergy   . Anxiety   . Depression   . Diabetes mellitus   . GERD (gastroesophageal reflux disease)   . Hypertension   . Hyperlipidemia   . Thyroid disease   . Stroke     Past Surgical History  Procedure Date  . Cholecystectomy   . Tonsillectomy   . Vaginal delivery     x5  . Tubal ligation   . Endoscopic vein laser treatment 2006  . Meniscus repair 11/08  right knee  . Total knee arthroplasty 05/09    after CVA  . Cataract extraction     left eye    Family History  Problem Relation Age of Onset  . Coronary artery disease Mother   . Breast cancer Mother   . Cancer Mother     breast cancer, ovarian cancer  . Heart failure Mother   . Breast cancer Sister   . Cancer Sister     breast cancer  . Breast cancer Maternal Aunt   . Cancer Maternal Aunt     breast cancer  . Breast cancer Maternal Grandmother   . Cancer Maternal Grandmother     breast cancer    History   Social History  . Marital Status: Single    Spouse Name: N/A    Number of Children: 5  . Years of Education: N/A   Occupational History  . DISABLED    Social History Main Topics  . Smoking status: Former Smoker    Types: Cigarettes    Quit date: 10/21/2005  . Smokeless tobacco: Never Used  . Alcohol Use: No  . Drug Use: No  . Sexually Active: Not on file   Other Topics Concern  . Not on file   Social History Narrative  . No  narrative on file   Review of Systems Sleeps well---"too much". Likes nap but then trouble initiating at night Appetite is fine Weight stable     Objective:   Physical Exam  Constitutional: She appears well-developed and well-nourished. No distress.  Neck: Normal range of motion. Neck supple. No thyromegaly present.  Cardiovascular: Normal rate, regular rhythm, normal heart sounds and intact distal pulses.  Exam reveals no gallop.   No murmur heard. Pulmonary/Chest: Effort normal and breath sounds normal. No respiratory distress. She has no wheezes. She has no rales.  Musculoskeletal: She exhibits no edema and no tenderness.  Lymphadenopathy:    She has no cervical adenopathy.  Psychiatric: She has a normal mood and affect. Her behavior is normal. Judgment and thought content normal.          Assessment & Plan:

## 2011-09-23 NOTE — Assessment & Plan Note (Signed)
Med decreased to 20mg  Will check labs

## 2011-09-23 NOTE — Assessment & Plan Note (Signed)
Ongoing symptoms for 2 weeks Will treat with amoxil Nose shows inflammation--chest and ears are clear

## 2011-10-03 ENCOUNTER — Other Ambulatory Visit: Payer: Self-pay | Admitting: *Deleted

## 2011-10-03 MED ORDER — CLORAZEPATE DIPOTASSIUM 7.5 MG PO TABS: ORAL_TABLET | ORAL | Status: DC

## 2011-10-03 NOTE — Telephone Encounter (Signed)
Spoke with patient and advised results rx called into pharmacy  

## 2011-10-03 NOTE — Telephone Encounter (Signed)
Okay #90 x 0 Please let her know that we are having everyone try to cut these potentially dangerous meds. Have her try 1/2 at a time and try to make the Rx last more than a month

## 2011-11-06 ENCOUNTER — Other Ambulatory Visit: Payer: Self-pay | Admitting: *Deleted

## 2011-11-06 MED ORDER — POTASSIUM CHLORIDE CRYS ER 20 MEQ PO TBCR: EXTENDED_RELEASE_TABLET | ORAL | Status: DC

## 2011-12-06 ENCOUNTER — Other Ambulatory Visit: Payer: Self-pay | Admitting: Internal Medicine

## 2011-12-20 ENCOUNTER — Encounter: Payer: Self-pay | Admitting: Family Medicine

## 2011-12-20 ENCOUNTER — Other Ambulatory Visit: Payer: Self-pay | Admitting: *Deleted

## 2011-12-20 ENCOUNTER — Ambulatory Visit (INDEPENDENT_AMBULATORY_CARE_PROVIDER_SITE_OTHER): Payer: PRIVATE HEALTH INSURANCE | Admitting: Family Medicine

## 2011-12-20 DIAGNOSIS — J019 Acute sinusitis, unspecified: Secondary | ICD-10-CM

## 2011-12-20 MED ORDER — AMOXICILLIN 875 MG PO TABS: 875.0000 mg | ORAL_TABLET | Freq: Two times a day (BID) | ORAL | Status: AC

## 2011-12-20 MED ORDER — CLORAZEPATE DIPOTASSIUM 7.5 MG PO TABS: ORAL_TABLET | ORAL | Status: DC

## 2011-12-20 NOTE — Patient Instructions (Signed)
Start the antibiotics today.  I would keep wearing a mask at the hospital.  Drink plenty of fluids and this should get better.  Take care.

## 2011-12-20 NOTE — Telephone Encounter (Signed)
Per verbal from Dr.Letvak ok to refill rx called into pharmacy

## 2011-12-20 NOTE — Progress Notes (Signed)
duration of symptoms: 1 week Rhinorrhea: no Congestion: yes ear pain: no but stuffy L ear sore throat: yes Cough: minimal Myalgias: no other concerns: some postnasal gtt She's not getting better over the last week.   Sister is inpatient at [facility], with poor prognosis.  She has heart failure and she isn't doing well.  Was on balloon pump and had a partial colectomy.  On vent now.  "It's been hell."  She's going back to see her sister today.  She's been wearing a mask at the hospital.    ROS: See HPI.  Otherwise negative.    Meds, vitals, and allergies reviewed.   GEN: nad, alert and oriented HEENT: mucous membranes moist, TM w/o erythema, nasal epithelium injected, OP with cobblestoning, max sinus ttp x2 NECK: supple w/o LA CV: rrr. PULM: ctab, no inc wob ABD: soft, +bs EXT: no edema

## 2011-12-20 NOTE — Telephone Encounter (Signed)
Patient states that her sister Elita Quick) is dying in [facility].  They are giving her less than 2 weeks.  She is very anxious and nervous and is asking for this refill ASAP.

## 2011-12-22 NOTE — Assessment & Plan Note (Signed)
Amoxil, rest and fluids.  D/w pt about mask at hospital. F/u prn.  Support offered re: her sister.

## 2012-01-02 ENCOUNTER — Other Ambulatory Visit: Payer: Self-pay | Admitting: Internal Medicine

## 2012-02-02 ENCOUNTER — Other Ambulatory Visit: Payer: Self-pay | Admitting: Internal Medicine

## 2012-02-24 ENCOUNTER — Other Ambulatory Visit: Payer: Self-pay | Admitting: *Deleted

## 2012-02-24 MED ORDER — CLORAZEPATE DIPOTASSIUM 7.5 MG PO TABS: ORAL_TABLET | ORAL | Status: DC

## 2012-02-24 NOTE — Telephone Encounter (Signed)
rx called into pharmacy

## 2012-02-24 NOTE — Telephone Encounter (Signed)
Okay #90 x 1 

## 2012-03-04 ENCOUNTER — Other Ambulatory Visit: Payer: Self-pay | Admitting: Internal Medicine

## 2012-03-19 ENCOUNTER — Ambulatory Visit: Payer: Self-pay | Admitting: Internal Medicine

## 2012-03-20 ENCOUNTER — Encounter: Payer: Self-pay | Admitting: Internal Medicine

## 2012-03-23 ENCOUNTER — Ambulatory Visit (INDEPENDENT_AMBULATORY_CARE_PROVIDER_SITE_OTHER): Payer: PRIVATE HEALTH INSURANCE | Admitting: Internal Medicine

## 2012-03-23 ENCOUNTER — Encounter: Payer: Self-pay | Admitting: Internal Medicine

## 2012-03-23 VITALS — BP 128/70 | HR 80 | Temp 97.5°F | Ht 64.0 in | Wt 216.0 lb

## 2012-03-23 DIAGNOSIS — I10 Essential (primary) hypertension: Secondary | ICD-10-CM

## 2012-03-23 DIAGNOSIS — Z23 Encounter for immunization: Secondary | ICD-10-CM

## 2012-03-23 DIAGNOSIS — F3289 Other specified depressive episodes: Secondary | ICD-10-CM

## 2012-03-23 DIAGNOSIS — E119 Type 2 diabetes mellitus without complications: Secondary | ICD-10-CM

## 2012-03-23 DIAGNOSIS — Z0000 Encounter for general adult medical examination without abnormal findings: Secondary | ICD-10-CM

## 2012-03-23 DIAGNOSIS — E785 Hyperlipidemia, unspecified: Secondary | ICD-10-CM

## 2012-03-23 DIAGNOSIS — E039 Hypothyroidism, unspecified: Secondary | ICD-10-CM

## 2012-03-23 DIAGNOSIS — F329 Major depressive disorder, single episode, unspecified: Secondary | ICD-10-CM

## 2012-03-23 DIAGNOSIS — IMO0001 Reserved for inherently not codable concepts without codable children: Secondary | ICD-10-CM

## 2012-03-23 LAB — MICROALBUMIN / CREATININE URINE RATIO
Microalb Creat Ratio: 1.8 mg/g (ref 0.0–30.0)
Microalb, Ur: 0.5 mg/dL (ref 0.0–1.9)

## 2012-03-23 LAB — CBC WITH DIFFERENTIAL/PLATELET
Basophils Relative: 0.7 % (ref 0.0–3.0)
Hemoglobin: 13.6 g/dL (ref 12.0–15.0)
Lymphocytes Relative: 34.3 % (ref 12.0–46.0)
Monocytes Relative: 8.1 % (ref 3.0–12.0)
Neutro Abs: 4.8 10*3/uL (ref 1.4–7.7)
RBC: 4.7 Mil/uL (ref 3.87–5.11)

## 2012-03-23 LAB — HEPATIC FUNCTION PANEL
ALT: 20 U/L (ref 0–35)
Alkaline Phosphatase: 105 U/L (ref 39–117)
Bilirubin, Direct: 0 mg/dL (ref 0.0–0.3)
Total Protein: 7.5 g/dL (ref 6.0–8.3)

## 2012-03-23 LAB — HEMOGLOBIN A1C: Hgb A1c MFr Bld: 7.5 % — ABNORMAL HIGH (ref 4.6–6.5)

## 2012-03-23 LAB — T4, FREE: Free T4: 0.66 ng/dL (ref 0.60–1.60)

## 2012-03-23 LAB — BASIC METABOLIC PANEL
CO2: 29 mEq/L (ref 19–32)
Calcium: 9.7 mg/dL (ref 8.4–10.5)
GFR: 71.51 mL/min (ref >60.00)
Sodium: 139 mEq/L (ref 135–145)

## 2012-03-23 LAB — LIPID PANEL: HDL: 63.1 mg/dL (ref >39.00)

## 2012-03-23 NOTE — Assessment & Plan Note (Signed)
BP Readings from Last 3 Encounters:  03/23/12 128/70  12/20/11 142/84  09/23/11 110/77   Good control No changes needed

## 2012-03-23 NOTE — Progress Notes (Signed)
Subjective:    Patient ID: Sheila Cunningham, female    DOB: 1936/01/12, 76 y.o.   MRN: [id number]  HPI Here for physical Still having troubles dealing with sister's death Prolonged life support at [facility] and this was really tough on her  UTD with imms except Td Cancer screening on schedule---just had mammo  Angry over sister's death and burial plans (son was POA) Not overtly depressed though Continues on the med  Checks sugars most days, or every other day Mostly good readings Highest 160 Did have hypoglycemic spell---missed a meal Kept up with eye doctor----due for cataract surgery later this month  Current Outpatient Prescriptions on File Prior to Visit  Medication Sig Dispense Refill  . aspirin 325 MG tablet Take 325 mg by mouth daily.        Marland Kitchen azelastine (ASTELIN) 137 MCG/SPRAY nasal spray Place 1 spray into the nose 2 (two) times daily. Use in each nostril as directed       . clorazepate (TRANXENE) 7.5 MG tablet Take 1 tablet by mouth 3 times a day as needed for anxiety.  90 tablet  1  . cyclobenzaprine (FLEXERIL) 5 MG tablet Take 5 mg by mouth daily as needed.        Marland Kitchen levothyroxine (SYNTHROID, LEVOTHROID) 25 MCG tablet TAKE 1 TABLET BY MOUTH ONCE DAILY  30 tablet  11  . loratadine (CLARITIN) 10 MG tablet Take 10 mg by mouth daily.        . metFORMIN (GLUCOPHAGE) 500 MG tablet TAKE 1 TABLET TWICE DAILY  60 tablet  11  . omeprazole (PRILOSEC) 20 MG capsule TAKE ONE CAPSULE BY MOUTH ONCE DAILY  30 capsule  3  . potassium chloride SA (KLOR-CON M20) 20 MEQ tablet Take 2 tablets by mouth daily  60 tablet  11  . simvastatin (ZOCOR) 40 MG tablet Take 0.5 tablets (20 mg total) by mouth at bedtime.  30 tablet  4  . triamterene-hydrochlorothiazide (MAXZIDE-25) 37.5-25 MG per tablet Take 1 tablet by mouth daily.  90 tablet  3  . TRUETEST TEST test strip TEST BLOOD SUGAR ONCE DAILY  50 each  3  . vitamin B-12 (CYANOCOBALAMIN) 1000 MCG tablet Take 2,000 mcg by mouth daily.          Allergies  Allergen Reactions  . Ace Inhibitors     REACTION: unspecified  . Alprazolam     REACTION: unspecified  . Amlodipine Besylate     REACTION: puffy eyes and hands  . Bupropion Hcl     REACTION: At high dose: Ill, hateful, sleepy  . Citalopram Hydrobromide     REACTION: unspecified  . Codeine Phosphate     REACTION: unspecified  . Fluoxetine Hcl     REACTION: "too strong"    Past Medical History  Diagnosis Date  . Allergy   . Anxiety   . Depression   . Diabetes mellitus   . GERD (gastroesophageal reflux disease)   . Hypertension   . Hyperlipidemia   . Thyroid disease   . Stroke     Past Surgical History  Procedure Date  . Cholecystectomy   . Tonsillectomy   . Vaginal delivery     x5  . Tubal ligation   . Endoscopic vein laser treatment 2006  . Meniscus repair 11/08    right knee  . Total knee arthroplasty 05/09    after CVA  . Cataract extraction     left eye    Family History  Problem Relation Age  of Onset  . Coronary artery disease Mother   . Breast cancer Mother   . Cancer Mother     breast cancer, ovarian cancer  . Heart failure Mother   . Breast cancer Sister   . Cancer Sister     breast cancer  . Breast cancer Maternal Aunt   . Cancer Maternal Aunt     breast cancer  . Breast cancer Maternal Grandmother   . Cancer Maternal Grandmother     breast cancer    History   Social History  . Marital Status: Single    Spouse Name: N/A    Number of Children: 5  . Years of Education: N/A   Occupational History  . DISABLED    Social History Main Topics  . Smoking status: Former Smoker    Types: Cigarettes    Quit date: 10/21/2005  . Smokeless tobacco: Never Used  . Alcohol Use: No  . Drug Use: No  . Sexually Active: Not on file   Other Topics Concern  . Not on file   Social History Narrative   Has living willSister Sheila Cunningham is her health care POA. Would accept brief resuscitation attempts but no prolonged artificial  ventilation.No tube feeds if cognitively unaware   Review of Systems  Constitutional: Negative for fatigue and unexpected weight change.       Wears seat belt  HENT: Positive for congestion, rhinorrhea and tinnitus. Negative for hearing loss.        Seasonal allergies---loratadine and astelin help Full top and lower dentures  Eyes: Positive for visual disturbance.       Due for cataract extraction  Respiratory: Negative for cough, chest tightness and shortness of breath.   Cardiovascular: Positive for palpitations. Negative for chest pain and leg swelling.       Palpitations if she drinks caffeine  Gastrointestinal: Negative for abdominal pain, constipation and blood in stool.       Having a lot of bloating and gas since starting probiotic---advised to try stopping Some heartburn but controlled with prilosec  Genitourinary: Negative for dysuria and urgency.       Occ leakage --esp first thing in AM  Musculoskeletal: Negative for back pain, joint swelling and arthralgias.  Skin: Negative for rash.       No suspicious lesions  Neurological: Negative for dizziness, syncope, weakness, light-headedness, numbness and headaches.  Hematological: Negative for adenopathy. Does not bruise/bleed easily.  Psychiatric/Behavioral: Positive for sleep disturbance and dysphoric mood. The patient is not nervous/anxious.        Chronic sleep problems--- doing okay though        Objective:   Physical Exam  Constitutional: She is oriented to person, place, and time. She appears well-developed and well-nourished. No distress.  HENT:  Head: Normocephalic and atraumatic.  Right Ear: External ear normal.  Left Ear: External ear normal.  Mouth/Throat: Oropharynx is clear and moist. No oropharyngeal exudate.  Eyes: Conjunctivae and EOM are normal. Pupils are equal, round, and reactive to light.  Neck: Normal range of motion. Neck supple. No thyromegaly present.  Cardiovascular: Normal rate, regular rhythm,  normal heart sounds and intact distal pulses.  Exam reveals no gallop.   No murmur heard. Pulmonary/Chest: Effort normal and breath sounds normal. No respiratory distress. She has no wheezes. She has no rales.  Abdominal: Soft. There is no tenderness.  Genitourinary:       No breast masses or tenderness  Musculoskeletal: She exhibits no edema and no tenderness.  Lymphadenopathy:    She has no cervical adenopathy.  Neurological: She is alert and oriented to person, place, and time.       Normal sensation in feet  Skin: Skin is warm. No rash noted. No erythema.  Psychiatric: She has a normal mood and affect. Her behavior is normal. Thought content normal.          Assessment & Plan:

## 2012-03-23 NOTE — Assessment & Plan Note (Signed)
Still grieving for sister but otherwise okay

## 2012-03-23 NOTE — Assessment & Plan Note (Signed)
Lab Results  Component Value Date   HGBA1C 7.5* 09/23/2011   Good control Will check full labs

## 2012-03-23 NOTE — Assessment & Plan Note (Signed)
UTD on cancer screening Td today Counseling done

## 2012-03-24 ENCOUNTER — Encounter: Payer: Self-pay | Admitting: *Deleted

## 2012-03-25 ENCOUNTER — Ambulatory Visit: Payer: Self-pay | Admitting: Ophthalmology

## 2012-04-06 ENCOUNTER — Ambulatory Visit: Payer: Self-pay | Admitting: Ophthalmology

## 2012-04-24 ENCOUNTER — Other Ambulatory Visit: Payer: Self-pay | Admitting: *Deleted

## 2012-04-24 MED ORDER — TRIAMTERENE-HCTZ 37.5-25 MG PO TABS: 1.0000 | ORAL_TABLET | Freq: Every day | ORAL | Status: DC

## 2012-04-24 MED ORDER — CLORAZEPATE DIPOTASSIUM 7.5 MG PO TABS: ORAL_TABLET | ORAL | Status: DC

## 2012-04-24 NOTE — Telephone Encounter (Signed)
Please call in

## 2012-04-24 NOTE — Telephone Encounter (Signed)
Sheila Cunningham 

## 2012-04-24 NOTE — Telephone Encounter (Signed)
rx called into pharmacy

## 2012-05-04 ENCOUNTER — Other Ambulatory Visit: Payer: Self-pay | Admitting: Internal Medicine

## 2012-06-04 ENCOUNTER — Other Ambulatory Visit: Payer: Self-pay | Admitting: *Deleted

## 2012-06-04 MED ORDER — CLORAZEPATE DIPOTASSIUM 7.5 MG PO TABS: 7.5000 mg | ORAL_TABLET | Freq: Three times a day (TID) | ORAL | Status: DC | PRN

## 2012-06-04 NOTE — Telephone Encounter (Signed)
rx called into pharmacy

## 2012-06-04 NOTE — Telephone Encounter (Signed)
Okay #90 x 0 

## 2012-07-06 ENCOUNTER — Other Ambulatory Visit: Payer: Self-pay | Admitting: *Deleted

## 2012-07-06 MED ORDER — LEVOTHYROXINE SODIUM 25 MCG PO TABS: 25.0000 ug | ORAL_TABLET | Freq: Every day | ORAL | Status: DC

## 2012-07-06 NOTE — Telephone Encounter (Signed)
Faxed refill request.  Last filled 06/04/12. 

## 2012-07-07 MED ORDER — CLORAZEPATE DIPOTASSIUM 7.5 MG PO TABS: 7.5000 mg | ORAL_TABLET | Freq: Three times a day (TID) | ORAL | Status: DC | PRN

## 2012-07-07 NOTE — Telephone Encounter (Signed)
rx called into pharmacy

## 2012-07-07 NOTE — Telephone Encounter (Signed)
Pt called pharmacy did not have Tranxene rx. Spoke with Misty Stanley at [facility] and filling Tranxene now. Pt notified while on phone.

## 2012-07-07 NOTE — Telephone Encounter (Signed)
Okay #90 x 0 

## 2012-08-10 ENCOUNTER — Other Ambulatory Visit: Payer: Self-pay | Admitting: *Deleted

## 2012-08-10 NOTE — Telephone Encounter (Signed)
Last filled 07/07/12

## 2012-08-11 MED ORDER — CLORAZEPATE DIPOTASSIUM 7.5 MG PO TABS: 7.5000 mg | ORAL_TABLET | Freq: Three times a day (TID) | ORAL | Status: DC | PRN

## 2012-08-11 NOTE — Telephone Encounter (Signed)
rx called into pharmacy

## 2012-08-11 NOTE — Telephone Encounter (Signed)
Okay #90 x 0 

## 2012-09-02 ENCOUNTER — Other Ambulatory Visit: Payer: Self-pay | Admitting: Internal Medicine

## 2012-09-03 ENCOUNTER — Ambulatory Visit (INDEPENDENT_AMBULATORY_CARE_PROVIDER_SITE_OTHER): Payer: PRIVATE HEALTH INSURANCE | Admitting: Internal Medicine

## 2012-09-03 ENCOUNTER — Encounter: Payer: Self-pay | Admitting: Internal Medicine

## 2012-09-03 VITALS — BP 120/80 | HR 91 | Temp 97.8°F | Wt 215.0 lb

## 2012-09-03 DIAGNOSIS — J019 Acute sinusitis, unspecified: Secondary | ICD-10-CM

## 2012-09-03 MED ORDER — AZELASTINE HCL 0.1 % NA SOLN: 1.0000 | Freq: Two times a day (BID) | NASAL | Status: DC

## 2012-09-03 MED ORDER — BENZONATATE 200 MG PO CAPS: 200.0000 mg | ORAL_CAPSULE | Freq: Three times a day (TID) | ORAL | Status: DC | PRN

## 2012-09-03 MED ORDER — AMOXICILLIN 500 MG PO TABS: 1000.0000 mg | ORAL_TABLET | Freq: Two times a day (BID) | ORAL | Status: DC

## 2012-09-03 NOTE — Assessment & Plan Note (Signed)
Seems to have secondary bacterial infection Will treat with amoxil and benzonatate

## 2012-09-03 NOTE — Progress Notes (Signed)
Subjective:    Patient ID: Sheila Cunningham, female    DOB: 06/12/1936, 76 y.o.   MRN: [id number]  HPI Having a sore throat, nasal congestion Headache Ongoing cough  Started a few weeks ago---but worse in last 2 days Now nose to congested to breathe through Purulent mucus---gray with blood. Coughs this up from PND  No fever Voice off in AM---better after shower No distinct SOB  Tried loratadine and asteline without success Current Outpatient Prescriptions on File Prior to Visit  Medication Sig Dispense Refill  . aspirin 325 MG tablet Take 325 mg by mouth daily.        Marland Kitchen azelastine (ASTELIN) 137 MCG/SPRAY nasal spray Place 1 spray into the nose 2 (two) times daily. Use in each nostril as directed       . Cholecalciferol (VITAMIN D3) 2000 UNITS capsule Take 2,000 Units by mouth daily.      . clorazepate (TRANXENE) 7.5 MG tablet Take 1 tablet (7.5 mg total) by mouth 3 (three) times daily as needed for anxiety.  90 tablet  0  . cyclobenzaprine (FLEXERIL) 5 MG tablet Take 5 mg by mouth daily as needed.        Marland Kitchen levothyroxine (SYNTHROID, LEVOTHROID) 25 MCG tablet Take 1 tablet (25 mcg total) by mouth daily.  30 tablet  11  . loratadine (CLARITIN) 10 MG tablet Take 10 mg by mouth daily.        . metFORMIN (GLUCOPHAGE) 500 MG tablet TAKE 1 TABLET TWICE DAILY  60 tablet  11  . potassium chloride SA (KLOR-CON M20) 20 MEQ tablet Take 2 tablets by mouth daily  60 tablet  11  . triamterene-hydrochlorothiazide (MAXZIDE-25) 37.5-25 MG per tablet Take 1 each (1 tablet total) by mouth daily.  90 tablet  3  . TRUETEST TEST test strip TEST BLOOD SUGAR ONCE DAILY  50 each  3  . vitamin B-12 (CYANOCOBALAMIN) 1000 MCG tablet Take 2,000 mcg by mouth daily.       Marland Kitchen omeprazole (PRILOSEC) 20 MG capsule TAKE ONE CAPSULE BY MOUTH ONCE DAILY  30 capsule  1    Allergies  Allergen Reactions  . Ace Inhibitors     REACTION: unspecified  . Alprazolam     REACTION: unspecified  . Amlodipine Besylate    REACTION: puffy eyes and hands  . Bupropion Hcl     REACTION: At high dose: Ill, hateful, sleepy  . Citalopram Hydrobromide     REACTION: unspecified  . Codeine Phosphate     REACTION: unspecified  . Fluoxetine Hcl     REACTION: "too strong"    Past Medical History  Diagnosis Date  . Allergy   . Anxiety   . Depression   . Diabetes mellitus   . GERD (gastroesophageal reflux disease)   . Hypertension   . Hyperlipidemia   . Thyroid disease   . Stroke     Past Surgical History  Procedure Date  . Cholecystectomy   . Tonsillectomy   . Vaginal delivery     x5  . Tubal ligation   . Endoscopic vein laser treatment 2006  . Meniscus repair 11/08    right knee  . Total knee arthroplasty 05/09    after CVA  . Cataract extraction     left eye    Family History  Problem Relation Age of Onset  . Coronary artery disease Mother   . Breast cancer Mother   . Cancer Mother     breast cancer, ovarian cancer  .  Heart failure Mother   . Breast cancer Sister   . Cancer Sister     breast cancer  . Breast cancer Maternal Aunt   . Cancer Maternal Aunt     breast cancer  . Breast cancer Maternal Grandmother   . Cancer Maternal Grandmother     breast cancer    History   Social History  . Marital Status: Single    Spouse Name: N/A    Number of Children: 5  . Years of Education: N/A   Occupational History  . DISABLED    Social History Main Topics  . Smoking status: Former Smoker    Types: Cigarettes    Quit date: 10/21/2005  . Smokeless tobacco: Never Used  . Alcohol Use: No  . Drug Use: No  . Sexually Active: Not on file   Other Topics Concern  . Not on file   Social History Narrative   Has living willSister Joanna Puff is her health care POA. Would accept brief resuscitation attempts but no prolonged artificial ventilation.No tube feeds if cognitively unaware   Review of Systems No rash No vomiting or diarrhea Appetite is okay---some sticking when she  swallows    Objective:   Physical Exam  Constitutional: She appears well-developed and well-nourished. No distress.  HENT:  Mouth/Throat: Oropharynx is clear and moist. No oropharyngeal exudate.       Moderate maxillary tenderness TMs negative Moderate nasal inflammation  Neck: Normal range of motion. Neck supple. No thyromegaly present.  Pulmonary/Chest: Effort normal and breath sounds normal. No respiratory distress. She has no wheezes. She has no rales.  Lymphadenopathy:    She has no cervical adenopathy.          Assessment & Plan:

## 2012-09-03 NOTE — Patient Instructions (Addendum)
Please call for a different antibiotic if you are not better by Monday

## 2012-09-08 ENCOUNTER — Other Ambulatory Visit: Payer: Self-pay | Admitting: Internal Medicine

## 2012-09-08 MED ORDER — AMOXICILLIN-POT CLAVULANATE 875-125 MG PO TABS: 1.0000 | ORAL_TABLET | Freq: Two times a day (BID) | ORAL | Status: DC

## 2012-09-08 NOTE — Telephone Encounter (Signed)
[facility] Triage Call Report Triage Record Num: [id number] Operator: Jeraldine Loots Patient Name: Sheila Cunningham Call Date & Time: 09/07/2012 5:40:45PM Patient Phone: [phone] PCP: Tillman Abide Patient Gender: Female PCP Fax : [phone] Patient DOB: 07-Jan-1936 Practice Name[facility] Reason for Call: Patient calling, continues to have a productive cough with hoarseness. Started on antibiotics and Tessalon Pearles last Thursday 11/14. Concerned that she isn't much better and was told to call if no improvement today. Was told that another anticiotic would be called in if no improvement. Uses same pharmacy as in EPIC chart. To continue with the current medications. Protocol(s) Used: Cough - Adult Recommended Outcome per Protocol: See Provider within 24 hours Reason for Outcome: Productive cough with colored sputum (other than clear or white sputum) Care Advice: Call provider if fever greater than 101.5 F (38.6 C) or 100.5 F (38.1C) in an immunocompromised patient (such as diabetes, HIV/AIDS, renal disease, chemotherapy, organ transplant, or chronic steroid use) has not improved in 24 hours. ~ Increase fluids to 8-12 eight oz (1.6 to 2.4 liters) glasses per day, half of them to be water. Soups, popsicles, fruit juices, non-caffeinated sodas (unless restricting sodium intake), jello, broths, decaf teas, etc. are all okay. Warm fluids can be soothing. ~ ~ HEALTH PROMOTION / MAINTENANCE ~ SYMPTOM / CONDITION MANAGEMENT 09/07/2012 5:47:47PM Page 1 of 1 [id number]

## 2012-09-08 NOTE — Telephone Encounter (Signed)
Please send Rx for augmentin 875mg bid  #20 x0  

## 2012-09-08 NOTE — Telephone Encounter (Signed)
Medication sent to pharmacy  

## 2012-09-14 ENCOUNTER — Other Ambulatory Visit: Payer: Self-pay | Admitting: Internal Medicine

## 2012-09-14 NOTE — Telephone Encounter (Signed)
rx called into pharmacy

## 2012-09-14 NOTE — Telephone Encounter (Signed)
Okay #90 x 0 

## 2012-09-22 ENCOUNTER — Encounter: Payer: Self-pay | Admitting: Internal Medicine

## 2012-09-22 ENCOUNTER — Ambulatory Visit (INDEPENDENT_AMBULATORY_CARE_PROVIDER_SITE_OTHER): Payer: PRIVATE HEALTH INSURANCE | Admitting: Internal Medicine

## 2012-09-22 VITALS — BP 140/80 | HR 84 | Temp 98.0°F | Wt 218.0 lb

## 2012-09-22 DIAGNOSIS — F329 Major depressive disorder, single episode, unspecified: Secondary | ICD-10-CM

## 2012-09-22 DIAGNOSIS — E119 Type 2 diabetes mellitus without complications: Secondary | ICD-10-CM

## 2012-09-22 DIAGNOSIS — I10 Essential (primary) hypertension: Secondary | ICD-10-CM

## 2012-09-22 DIAGNOSIS — E785 Hyperlipidemia, unspecified: Secondary | ICD-10-CM

## 2012-09-22 NOTE — Assessment & Plan Note (Signed)
Still grieving for sister No MDD though No specific Rx

## 2012-09-22 NOTE — Assessment & Plan Note (Signed)
Lab Results  Component Value Date   HGBA1C 7.5* 03/23/2012   Seems to still have adequate control Will recheck
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
## 2012-09-22 NOTE — Assessment & Plan Note (Signed)
Lab Results  Component Value Date   LDLCALC 60 03/23/2012   Good control No problems with the med

## 2012-09-22 NOTE — Assessment & Plan Note (Signed)
BP Readings from Last 3 Encounters:  09/22/12 140/80  09/03/12 120/80  03/23/12 128/70   Good control No changes needed

## 2012-09-24 ENCOUNTER — Encounter: Payer: Self-pay | Admitting: *Deleted

## 2012-10-13 ENCOUNTER — Other Ambulatory Visit: Payer: Self-pay | Admitting: Internal Medicine

## 2012-10-13 NOTE — Telephone Encounter (Signed)
rx called in

## 2012-10-13 NOTE — Telephone Encounter (Signed)
Okay #90 x 0 

## 2012-10-31 ENCOUNTER — Other Ambulatory Visit: Payer: Self-pay | Admitting: Internal Medicine

## 2012-11-04 ENCOUNTER — Other Ambulatory Visit: Payer: Self-pay | Admitting: Internal Medicine

## 2012-11-17 ENCOUNTER — Other Ambulatory Visit: Payer: Self-pay | Admitting: Internal Medicine

## 2012-11-18 NOTE — Telephone Encounter (Signed)
rx called into pharmacy

## 2012-11-18 NOTE — Telephone Encounter (Signed)
Okay #90 x 0 

## 2012-12-02 ENCOUNTER — Other Ambulatory Visit: Payer: Self-pay | Admitting: *Deleted

## 2012-12-02 MED ORDER — METFORMIN HCL 500 MG PO TABS: ORAL_TABLET | ORAL | Status: DC

## 2012-12-16 ENCOUNTER — Other Ambulatory Visit: Payer: Self-pay | Admitting: Internal Medicine

## 2012-12-17 NOTE — Telephone Encounter (Signed)
rx called into pharmacy

## 2012-12-17 NOTE — Telephone Encounter (Signed)
Okay #90 x 0 

## 2013-01-14 ENCOUNTER — Other Ambulatory Visit: Payer: Self-pay | Admitting: Internal Medicine

## 2013-01-15 NOTE — Telephone Encounter (Signed)
Okay #90 x 0 

## 2013-01-15 NOTE — Telephone Encounter (Signed)
rx called into pharmacy

## 2013-01-23 ENCOUNTER — Other Ambulatory Visit: Payer: Self-pay | Admitting: Internal Medicine

## 2013-02-16 ENCOUNTER — Other Ambulatory Visit: Payer: Self-pay | Admitting: Internal Medicine

## 2013-02-16 NOTE — Telephone Encounter (Signed)
rx called into pharmacy

## 2013-02-16 NOTE — Telephone Encounter (Signed)
Okay #90 x 0 

## 2013-03-16 ENCOUNTER — Other Ambulatory Visit: Payer: Self-pay | Admitting: Internal Medicine

## 2013-03-16 NOTE — Telephone Encounter (Signed)
rx called into pharmacy

## 2013-03-16 NOTE — Telephone Encounter (Signed)
Okay #90 x 0 

## 2013-03-25 ENCOUNTER — Encounter: Payer: Self-pay | Admitting: Radiology

## 2013-03-26 ENCOUNTER — Ambulatory Visit (INDEPENDENT_AMBULATORY_CARE_PROVIDER_SITE_OTHER): Payer: Medicare Other | Admitting: Internal Medicine

## 2013-03-26 ENCOUNTER — Encounter: Payer: Self-pay | Admitting: Internal Medicine

## 2013-03-26 VITALS — BP 128/88 | HR 83 | Temp 98.6°F | Wt 216.0 lb

## 2013-03-26 DIAGNOSIS — E039 Hypothyroidism, unspecified: Secondary | ICD-10-CM

## 2013-03-26 DIAGNOSIS — Z0000 Encounter for general adult medical examination without abnormal findings: Secondary | ICD-10-CM

## 2013-03-26 DIAGNOSIS — E785 Hyperlipidemia, unspecified: Secondary | ICD-10-CM | POA: Diagnosis not present

## 2013-03-26 DIAGNOSIS — E119 Type 2 diabetes mellitus without complications: Secondary | ICD-10-CM | POA: Diagnosis not present

## 2013-03-26 DIAGNOSIS — F411 Generalized anxiety disorder: Secondary | ICD-10-CM

## 2013-03-26 DIAGNOSIS — K219 Gastro-esophageal reflux disease without esophagitis: Secondary | ICD-10-CM | POA: Diagnosis not present

## 2013-03-26 DIAGNOSIS — I10 Essential (primary) hypertension: Secondary | ICD-10-CM | POA: Diagnosis not present

## 2013-03-26 LAB — CBC WITH DIFFERENTIAL/PLATELET
Basophils Absolute: 0.1 10*3/uL (ref 0.0–0.1)
Basophils Relative: 1 % (ref 0–1)
Eosinophils Absolute: 0.3 10*3/uL (ref 0.0–0.7)
MCH: 28.2 pg (ref 26.0–34.0)
MCHC: 32.9 g/dL (ref 30.0–36.0)
Monocytes Relative: 8 % (ref 3–12)
Neutro Abs: 6.3 10*3/uL (ref 1.7–7.7)
Neutrophils Relative %: 55 % (ref 43–77)
Platelets: 323 10*3/uL (ref 150–400)
RDW: 14.1 % (ref 11.5–15.5)

## 2013-03-26 LAB — BASIC METABOLIC PANEL
BUN: 16 mg/dL (ref 6–23)
CO2: 29 mEq/L (ref 19–32)
Calcium: 10.1 mg/dL (ref 8.4–10.5)
Chloride: 100 mEq/L (ref 96–112)
Creat: 0.89 mg/dL (ref 0.50–1.10)
Glucose, Bld: 154 mg/dL — ABNORMAL HIGH (ref 70–99)

## 2013-03-26 LAB — HEPATIC FUNCTION PANEL
Albumin: 4.4 g/dL (ref 3.5–5.2)
Alkaline Phosphatase: 87 U/L (ref 39–117)
Total Protein: 7.1 g/dL (ref 6.0–8.3)

## 2013-03-26 LAB — LIPID PANEL
HDL: 58 mg/dL (ref >39)
Total CHOL/HDL Ratio: 3.2 Ratio
Triglycerides: 144 mg/dL (ref <150)

## 2013-03-26 MED ORDER — METFORMIN HCL 1000 MG PO TABS: 1000.0000 mg | ORAL_TABLET | Freq: Two times a day (BID) | ORAL | Status: DC

## 2013-03-26 MED ORDER — OMEPRAZOLE 20 MG PO CPDR: 20.0000 mg | DELAYED_RELEASE_CAPSULE | Freq: Every day | ORAL | Status: DC

## 2013-03-26 NOTE — Assessment & Plan Note (Signed)
Uses the tranxene regularly with success
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
## 2013-03-26 NOTE — Assessment & Plan Note (Signed)
I have personally reviewed the Medicare Annual Wellness questionnaire and have noted 1. The patient's medical and social history 2. Their use of alcohol, tobacco or illicit drugs 3. Their current medications and supplements 4. The patient's functional ability including ADL's, fall risks, home safety risks and hearing or visual             impairment. 5. Diet and physical activities 6. Evidence for depression or mood disorders  The patients weight, height, BMI and visual acuity have been recorded in the chart I have made referrals, counseling and provided education to the patient based review of the above and I have provided the pt with a written personalized care plan for preventive services.  I have provided you with a copy of your personalized plan for preventive services. Please take the time to review along with your updated medication list.  She wants to continue yearly mammograms for now No other concerns UTD on imms

## 2013-03-26 NOTE — Patient Instructions (Addendum)
Please try the simvastatin again. If your leg cramps get really worse again, we will change it. Please increase your metformin to 1000mg  twice a day. You can set up your own screening mammogram

## 2013-03-26 NOTE — Assessment & Plan Note (Signed)
Okay with the PPI 

## 2013-03-26 NOTE — Assessment & Plan Note (Signed)
BP Readings from Last 3 Encounters:  03/26/13 128/88  09/22/12 140/80  09/03/12 120/80   Good control Intolerant of ACEI--will check for albuminuria

## 2013-03-26 NOTE — Assessment & Plan Note (Signed)
Thinks she had reaction with leg cramps If symptoms recur on rechallenge---will change to low dose atorvastatin

## 2013-03-26 NOTE — Assessment & Plan Note (Signed)
Sugars are not great Will increase the metformin to 1000mg  bid

## 2013-03-27 LAB — MICROALBUMIN / CREATININE URINE RATIO
Microalb Creat Ratio: 4.5 mg/g (ref 0.0–30.0)
Microalb, Ur: 0.59 mg/dL (ref 0.00–1.89)

## 2013-03-27 LAB — HEMOGLOBIN A1C
Hgb A1c MFr Bld: 8.1 % — ABNORMAL HIGH (ref <5.7)
Mean Plasma Glucose: 186 mg/dL — ABNORMAL HIGH (ref <117)

## 2013-03-27 LAB — TSH: TSH: 3.58 u[IU]/mL (ref 0.350–4.500)

## 2013-03-28 ENCOUNTER — Other Ambulatory Visit: Payer: Self-pay | Admitting: Internal Medicine

## 2013-03-29 ENCOUNTER — Encounter: Payer: Self-pay | Admitting: *Deleted

## 2013-04-14 ENCOUNTER — Other Ambulatory Visit: Payer: Self-pay | Admitting: Internal Medicine

## 2013-04-15 NOTE — Telephone Encounter (Signed)
rx called into pharmacy

## 2013-04-15 NOTE — Telephone Encounter (Signed)
Okay #90 x 0 

## 2013-05-05 ENCOUNTER — Ambulatory Visit: Payer: Self-pay | Admitting: Internal Medicine

## 2013-05-05 DIAGNOSIS — R922 Inconclusive mammogram: Secondary | ICD-10-CM | POA: Diagnosis not present

## 2013-05-05 DIAGNOSIS — Z803 Family history of malignant neoplasm of breast: Secondary | ICD-10-CM | POA: Diagnosis not present

## 2013-05-05 DIAGNOSIS — Z1231 Encounter for screening mammogram for malignant neoplasm of breast: Secondary | ICD-10-CM | POA: Diagnosis not present

## 2013-05-05 DIAGNOSIS — R928 Other abnormal and inconclusive findings on diagnostic imaging of breast: Secondary | ICD-10-CM | POA: Diagnosis not present

## 2013-05-06 ENCOUNTER — Encounter: Payer: Self-pay | Admitting: Internal Medicine

## 2013-05-07 ENCOUNTER — Encounter: Payer: Self-pay | Admitting: *Deleted

## 2013-05-13 ENCOUNTER — Encounter: Payer: Self-pay | Admitting: Internal Medicine

## 2013-05-13 ENCOUNTER — Encounter: Payer: Self-pay | Admitting: *Deleted

## 2013-05-13 ENCOUNTER — Ambulatory Visit (INDEPENDENT_AMBULATORY_CARE_PROVIDER_SITE_OTHER): Payer: Medicare Other | Admitting: Internal Medicine

## 2013-05-13 VITALS — BP 160/80 | HR 100 | Temp 98.0°F | Wt 213.0 lb

## 2013-05-13 DIAGNOSIS — I809 Phlebitis and thrombophlebitis of unspecified site: Secondary | ICD-10-CM | POA: Diagnosis not present

## 2013-05-13 DIAGNOSIS — J019 Acute sinusitis, unspecified: Secondary | ICD-10-CM

## 2013-05-13 MED ORDER — CLORAZEPATE DIPOTASSIUM 7.5 MG PO TABS: 7.5000 mg | ORAL_TABLET | Freq: Three times a day (TID) | ORAL | Status: DC | PRN

## 2013-05-13 MED ORDER — AMOXICILLIN 500 MG PO TABS: 1000.0000 mg | ORAL_TABLET | Freq: Two times a day (BID) | ORAL | Status: DC

## 2013-05-13 NOTE — Assessment & Plan Note (Signed)
Reassured that no signs of DVT Discussed using warm compresses

## 2013-05-13 NOTE — Addendum Note (Signed)
Addended by: Sueanne Margarita on: 05/13/2013 04:52 PM   Modules accepted: Orders

## 2013-05-13 NOTE — Patient Instructions (Signed)
Try a heating pad on your right thigh veins that are tender

## 2013-05-13 NOTE — Assessment & Plan Note (Signed)
10 days of symptoms and allergy meds not helpful Will rx with amoxil

## 2013-05-13 NOTE — Progress Notes (Signed)
Subjective:    Patient ID: Sheila Cunningham, female    DOB: July 10, 1936, 77 y.o.   MRN: [id number]  HPI Having bad nasal drainage Throat sore due to this Sinus discomfort for 10 days or so Sneezing--cough seems better No fever No ear pain Does feel sick--- "lousy" Using loratadine and nasal spray -- not really helping  Tenderness in right calf Feels knots in thigh Also for about 10 days Has appt with ortho but worried about it Knee is popping TKR 5 years ago  Current Outpatient Prescriptions on File Prior to Visit  Medication Sig Dispense Refill  . aspirin 325 MG tablet Take 325 mg by mouth daily.        Marland Kitchen azelastine (ASTELIN) 137 MCG/SPRAY nasal spray Place 1 spray into the nose 2 (two) times daily. Use in each nostril as directed  30 mL  11  . Cholecalciferol (VITAMIN D3) 2000 UNITS capsule Take 2,000 Units by mouth daily.      . clorazepate (TRANXENE) 7.5 MG tablet TAKE 1 TABLET BY MOUTH 3 TIMES A DAY AS NEEDED  90 tablet  0  . cyclobenzaprine (FLEXERIL) 5 MG tablet Take 5 mg by mouth daily as needed.        Marland Kitchen KLOR-CON M20 20 MEQ tablet TAKE 2 TABLETS BY MOUTH DAILY  60 tablet  11  . levothyroxine (SYNTHROID, LEVOTHROID) 25 MCG tablet Take 1 tablet (25 mcg total) by mouth daily.  30 tablet  11  . loratadine (CLARITIN) 10 MG tablet Take 10 mg by mouth daily.        . metFORMIN (GLUCOPHAGE) 1000 MG tablet Take 1 tablet (1,000 mg total) by mouth 2 (two) times daily with a meal.  180 tablet  3  . omeprazole (PRILOSEC) 20 MG capsule Take 1 capsule (20 mg total) by mouth daily.  90 capsule  3  . simvastatin (ZOCOR) 40 MG tablet TAKE 1/2 TABLET BY MOUTH AT BEDTIME  30 tablet  3  . triamterene-hydrochlorothiazide (MAXZIDE-25) 37.5-25 MG per tablet TAKE 1 TABLET EVERY DAY  90 tablet  3  . TRUETEST TEST test strip TEST BLOOD SUGAR ONCE DAILY  50 each  3  . vitamin B-12 (CYANOCOBALAMIN) 1000 MCG tablet Take 2,000 mcg by mouth daily.        No current facility-administered medications on  file prior to visit.    Allergies  Allergen Reactions  . Ace Inhibitors     REACTION: unspecified  . Alprazolam     REACTION: unspecified  . Amlodipine Besylate     REACTION: puffy eyes and hands  . Bupropion Hcl     REACTION: At high dose: Ill, hateful, sleepy  . Citalopram Hydrobromide     REACTION: unspecified  . Codeine Phosphate     REACTION: unspecified  . Fluoxetine Hcl     REACTION: "too strong"    Past Medical History  Diagnosis Date  . Allergy   . Anxiety   . Depression   . Diabetes mellitus   . GERD (gastroesophageal reflux disease)   . Hypertension   . Hyperlipidemia   . Thyroid disease   . Stroke     Past Surgical History  Procedure Laterality Date  . Cholecystectomy    . Tonsillectomy    . Vaginal delivery      x5  . Tubal ligation    . Endoscopic vein laser treatment  2006  . Meniscus repair  11/08    right knee  . Total knee arthroplasty  05/09  after CVA  . Cataract extraction      left eye    Family History  Problem Relation Age of Onset  . Coronary artery disease Mother   . Breast cancer Mother   . Cancer Mother     breast cancer, ovarian cancer  . Heart failure Mother   . Breast cancer Sister   . Cancer Sister     breast cancer  . Breast cancer Maternal Aunt   . Cancer Maternal Aunt     breast cancer  . Breast cancer Maternal Grandmother   . Cancer Maternal Grandmother     breast cancer    History   Social History  . Marital Status: Single    Spouse Name: N/A    Number of Children: 5  . Years of Education: N/A   Occupational History  . DISABLED    Social History Main Topics  . Smoking status: Former Smoker    Types: Cigarettes    Quit date: 10/21/2005  . Smokeless tobacco: Never Used  . Alcohol Use: No  . Drug Use: No  . Sexually Active: Not on file   Other Topics Concern  . Not on file   Social History Narrative   Has living will   Sister Joanna Puff or daughter (at [place]) is her health care POA.     Would accept brief resuscitation attempts but no prolonged artificial ventilation.   No tube feeds if cognitively unaware   Review of Systems No vomiting or diarrhea    Objective:   Physical Exam  Constitutional: She appears well-developed and well-nourished.  HENT:  Mouth/Throat: Oropharynx is clear and moist. No oropharyngeal exudate.  Moderate maxillary tenderness. No frontal tenderness Mild nasal congestion TMs negative  Neck: Neck supple.  Pulmonary/Chest: Effort normal and breath sounds normal. No respiratory distress. She has no wheezes. She has no rales.  Musculoskeletal:  Mildly tender superficial veins in medial right thigh above the knee. No painful calf---some varicosities there also  Lymphadenopathy:    She has no cervical adenopathy.          Assessment & Plan:

## 2013-05-18 DIAGNOSIS — Z79899 Other long term (current) drug therapy: Secondary | ICD-10-CM | POA: Diagnosis not present

## 2013-05-24 ENCOUNTER — Encounter: Payer: Self-pay | Admitting: Internal Medicine

## 2013-06-09 DIAGNOSIS — Z96659 Presence of unspecified artificial knee joint: Secondary | ICD-10-CM | POA: Diagnosis not present

## 2013-06-14 ENCOUNTER — Other Ambulatory Visit: Payer: Self-pay | Admitting: Internal Medicine

## 2013-06-14 NOTE — Telephone Encounter (Signed)
rx called into pharmacy

## 2013-06-14 NOTE — Telephone Encounter (Signed)
Okay #90 x 0 

## 2013-06-15 DIAGNOSIS — L820 Inflamed seborrheic keratosis: Secondary | ICD-10-CM | POA: Diagnosis not present

## 2013-06-15 DIAGNOSIS — D179 Benign lipomatous neoplasm, unspecified: Secondary | ICD-10-CM | POA: Diagnosis not present

## 2013-06-23 ENCOUNTER — Other Ambulatory Visit: Payer: Self-pay | Admitting: Internal Medicine

## 2013-06-23 NOTE — Telephone Encounter (Signed)
rx sent to pharmacy by e-script  

## 2013-07-15 ENCOUNTER — Other Ambulatory Visit: Payer: Self-pay | Admitting: Internal Medicine

## 2013-07-16 NOTE — Telephone Encounter (Signed)
Okay #90 x 0 

## 2013-07-16 NOTE — Telephone Encounter (Signed)
rx called into pharmacy

## 2013-07-17 DIAGNOSIS — Z23 Encounter for immunization: Secondary | ICD-10-CM | POA: Diagnosis not present

## 2013-07-23 ENCOUNTER — Encounter: Payer: Self-pay | Admitting: Internal Medicine

## 2013-07-23 ENCOUNTER — Ambulatory Visit (INDEPENDENT_AMBULATORY_CARE_PROVIDER_SITE_OTHER): Payer: Medicare Other | Admitting: Internal Medicine

## 2013-07-23 VITALS — BP 120/70 | HR 83 | Temp 98.0°F | Wt 212.0 lb

## 2013-07-23 DIAGNOSIS — J019 Acute sinusitis, unspecified: Secondary | ICD-10-CM

## 2013-07-23 MED ORDER — PREDNISONE 20 MG PO TABS: 40.0000 mg | ORAL_TABLET | Freq: Every day | ORAL | Status: DC

## 2013-07-23 MED ORDER — AMOXICILLIN-POT CLAVULANATE 875-125 MG PO TABS: 1.0000 | ORAL_TABLET | Freq: Two times a day (BID) | ORAL | Status: DC

## 2013-07-23 NOTE — Progress Notes (Signed)
Subjective:    Patient ID: Sheila Cunningham, female    DOB: September 25, 1936, 77 y.o.   MRN: [id number]  HPI Doesn't think she ever got back to normal since 2 months ago Then worse in the past 2 months Throat is tight---trouble swallowing and some breathing problems Head is congested  Did get high dose flu shot about 10 days ago  Has post nasal drip No fever Lots of cough---if hot (like with exertion) and in bed No ear pain Does note increased DOE  Using nasal spray and loratadine  Current Outpatient Prescriptions on File Prior to Visit  Medication Sig Dispense Refill  . aspirin 325 MG tablet Take 325 mg by mouth daily.        Marland Kitchen azelastine (ASTELIN) 137 MCG/SPRAY nasal spray Place 1 spray into the nose 2 (two) times daily. Use in each nostril as directed  30 mL  11  . Cholecalciferol (VITAMIN D3) 2000 UNITS capsule Take 2,000 Units by mouth daily.      . clorazepate (TRANXENE) 7.5 MG tablet TAKE 1 TABLET BY MOUTH 3 TIMES A DAY AS NEEDED FOR ANXIETY  90 tablet  0  . cyclobenzaprine (FLEXERIL) 5 MG tablet Take 5 mg by mouth daily as needed.        Marland Kitchen glucose blood (TRUETEST TEST) test strip Used to test blood sugar once daily dx: 250.00  100 each  2  . KLOR-CON M20 20 MEQ tablet TAKE 2 TABLETS BY MOUTH DAILY  60 tablet  11  . levothyroxine (SYNTHROID, LEVOTHROID) 25 MCG tablet TAKE 1 TABLET BY MOUTH EVERY DAY  30 tablet  11  . loratadine (CLARITIN) 10 MG tablet Take 10 mg by mouth daily.        . metFORMIN (GLUCOPHAGE) 1000 MG tablet Take 1 tablet (1,000 mg total) by mouth 2 (two) times daily with a meal.  180 tablet  3  . omeprazole (PRILOSEC) 20 MG capsule Take 1 capsule (20 mg total) by mouth daily.  90 capsule  3  . simvastatin (ZOCOR) 40 MG tablet TAKE 1/2 TABLET BY MOUTH AT BEDTIME  30 tablet  3  . triamterene-hydrochlorothiazide (MAXZIDE-25) 37.5-25 MG per tablet TAKE 1 TABLET EVERY DAY  90 tablet  3  . vitamin B-12 (CYANOCOBALAMIN) 1000 MCG tablet Take 2,000 mcg by mouth daily.         No current facility-administered medications on file prior to visit.    Allergies  Allergen Reactions  . Ace Inhibitors     REACTION: unspecified  . Alprazolam     REACTION: unspecified  . Amlodipine Besylate     REACTION: puffy eyes and hands  . Bupropion Hcl     REACTION: At high dose: Ill, hateful, sleepy  . Citalopram Hydrobromide     REACTION: unspecified  . Codeine Phosphate     REACTION: unspecified  . Fluoxetine Hcl     REACTION: "too strong"    Past Medical History  Diagnosis Date  . Allergy   . Anxiety   . Depression   . Diabetes mellitus   . GERD (gastroesophageal reflux disease)   . Hypertension   . Hyperlipidemia   . Thyroid disease   . Stroke     Past Surgical History  Procedure Laterality Date  . Cholecystectomy    . Tonsillectomy    . Vaginal delivery      x5  . Tubal ligation    . Endoscopic vein laser treatment  2006  . Meniscus repair  11/08  right knee  . Total knee arthroplasty  05/09    after CVA  . Cataract extraction      left eye    Family History  Problem Relation Age of Onset  . Coronary artery disease Mother   . Breast cancer Mother   . Cancer Mother     breast cancer, ovarian cancer  . Heart failure Mother   . Breast cancer Sister   . Cancer Sister     breast cancer  . Breast cancer Maternal Aunt   . Cancer Maternal Aunt     breast cancer  . Breast cancer Maternal Grandmother   . Cancer Maternal Grandmother     breast cancer    History   Social History  . Marital Status: Single    Spouse Name: N/A    Number of Children: 5  . Years of Education: N/A   Occupational History  . DISABLED    Social History Main Topics  . Smoking status: Former Smoker    Types: Cigarettes    Quit date: 10/21/2005  . Smokeless tobacco: Never Used  . Alcohol Use: No  . Drug Use: No  . Sexual Activity: Not on file   Other Topics Concern  . Not on file   Social History Narrative   Has living will   Sister Joanna Puff or daughter (at [place]) is her health care POA.    Would accept brief resuscitation attempts but no prolonged artificial ventilation.   No tube feeds if cognitively unaware   Review of Systems No vomiting or diarrhea Appetite is off Sugars okay No rash     Objective:   Physical Exam  Constitutional: She appears well-developed and well-nourished. No distress.  HENT:  Mild frontal tenderness Marked nasal congestion TMs normal Slight pharyngeal injection without exudates  Neck: Normal range of motion. Neck supple. No thyromegaly present.  Pulmonary/Chest: Effort normal and breath sounds normal. No respiratory distress. She has no wheezes. She has no rales.  Lymphadenopathy:    She has no cervical adenopathy.          Assessment & Plan:

## 2013-07-23 NOTE — Assessment & Plan Note (Signed)
Recurrent or persistent Will treat with 3 weeks of augmentin 3 days of prednisone to clear up congestion some

## 2013-07-23 NOTE — Patient Instructions (Signed)
Please get a cough suppressant with just dextromethorphan in it

## 2013-08-12 ENCOUNTER — Other Ambulatory Visit: Payer: Self-pay | Admitting: Internal Medicine

## 2013-08-12 NOTE — Telephone Encounter (Signed)
rx called into pharmacy

## 2013-08-12 NOTE — Telephone Encounter (Signed)
Okay #90 x 0 

## 2013-08-12 NOTE — Telephone Encounter (Signed)
Last filled 07/15/13 

## 2013-09-09 ENCOUNTER — Other Ambulatory Visit: Payer: Self-pay | Admitting: Internal Medicine

## 2013-09-09 NOTE — Telephone Encounter (Signed)
rx called into pharmacy

## 2013-09-09 NOTE — Telephone Encounter (Signed)
Last filled 08/12/13

## 2013-09-09 NOTE — Telephone Encounter (Signed)
Okay #90 x 0 

## 2013-09-27 ENCOUNTER — Ambulatory Visit (INDEPENDENT_AMBULATORY_CARE_PROVIDER_SITE_OTHER): Payer: Medicare Other | Admitting: Internal Medicine

## 2013-09-27 ENCOUNTER — Encounter: Payer: Self-pay | Admitting: Internal Medicine

## 2013-09-27 VITALS — BP 144/88 | HR 88 | Temp 97.7°F | Wt 216.0 lb

## 2013-09-27 DIAGNOSIS — E119 Type 2 diabetes mellitus without complications: Secondary | ICD-10-CM

## 2013-09-27 DIAGNOSIS — F411 Generalized anxiety disorder: Secondary | ICD-10-CM | POA: Diagnosis not present

## 2013-09-27 DIAGNOSIS — I10 Essential (primary) hypertension: Secondary | ICD-10-CM

## 2013-09-27 DIAGNOSIS — E785 Hyperlipidemia, unspecified: Secondary | ICD-10-CM

## 2013-09-27 NOTE — Progress Notes (Signed)
Pre-visit discussion using our clinic review tool. No additional management support is needed unless otherwise documented below in the visit note.  

## 2013-09-27 NOTE — Assessment & Plan Note (Signed)
BP Readings from Last 3 Encounters:  09/27/13 144/88  07/23/13 120/70  05/13/13 160/80   Generally okay control Some stress lately Will not change anything now

## 2013-09-27 NOTE — Progress Notes (Signed)
Subjective:    Patient ID: Sheila Cunningham, female    DOB: 28-Mar-1936, 77 y.o.   MRN: [id number]  HPI Here for follow up  Sinuses are some better  Having bad problems with leg cramps Tried vinegar and mustard without improvement [place] contentious custody problems with son's grandchild-- she was testifying Had to walk steps Went to orthopedist --no answers  Stress with this and son He is having ongoing problems with his leg and bad pain May need to have it amputated This keeps her mood on edge  Checks sugars weekly or so Usually ~140 fasting No hypoglycemia  No sensory changes in feet  No chest pain No SOB No sig edema  Current Outpatient Prescriptions on File Prior to Visit  Medication Sig Dispense Refill  . aspirin 325 MG tablet Take 325 mg by mouth daily.        Marland Kitchen azelastine (ASTELIN) 137 MCG/SPRAY nasal spray Place 1 spray into the nose 2 (two) times daily. Use in each nostril as directed  30 mL  11  . Cholecalciferol (VITAMIN D3) 2000 UNITS capsule Take 2,000 Units by mouth daily.      . clorazepate (TRANXENE) 7.5 MG tablet TAKE 1 TABLET BY MOUTH 3 TIMES A DAY AS NEEDED FOR ANXIETY  90 tablet  0  . cyclobenzaprine (FLEXERIL) 5 MG tablet Take 5 mg by mouth daily as needed.        Marland Kitchen glucose blood (TRUETEST TEST) test strip Used to test blood sugar once daily dx: 250.00  100 each  2  . KLOR-CON M20 20 MEQ tablet TAKE 2 TABLETS BY MOUTH DAILY  60 tablet  11  . levothyroxine (SYNTHROID, LEVOTHROID) 25 MCG tablet TAKE 1 TABLET BY MOUTH EVERY DAY  30 tablet  11  . loratadine (CLARITIN) 10 MG tablet Take 10 mg by mouth daily.        . metFORMIN (GLUCOPHAGE) 1000 MG tablet Take 1 tablet (1,000 mg total) by mouth 2 (two) times daily with a meal.  180 tablet  3  . omeprazole (PRILOSEC) 20 MG capsule Take 1 capsule (20 mg total) by mouth daily.  90 capsule  3  . simvastatin (ZOCOR) 40 MG tablet TAKE 1/2 TABLET BY MOUTH AT BEDTIME  30 tablet  3  .  triamterene-hydrochlorothiazide (MAXZIDE-25) 37.5-25 MG per tablet TAKE 1 TABLET EVERY DAY  90 tablet  3  . vitamin B-12 (CYANOCOBALAMIN) 1000 MCG tablet Take 2,000 mcg by mouth daily.        No current facility-administered medications on file prior to visit.    Allergies  Allergen Reactions  . Ace Inhibitors     REACTION: unspecified  . Alprazolam     REACTION: unspecified  . Amlodipine Besylate     REACTION: puffy eyes and hands  . Bupropion Hcl     REACTION: At high dose: Ill, hateful, sleepy  . Citalopram Hydrobromide     REACTION: unspecified  . Codeine Phosphate     REACTION: unspecified  . Fluoxetine Hcl     REACTION: "too strong"    Past Medical History  Diagnosis Date  . Allergy   . Anxiety   . Depression   . Diabetes mellitus   . GERD (gastroesophageal reflux disease)   . Hypertension   . Hyperlipidemia   . Thyroid disease   . Stroke     Past Surgical History  Procedure Laterality Date  . Cholecystectomy    . Tonsillectomy    .  Vaginal delivery      x5  . Tubal ligation    . Endoscopic vein laser treatment  2006  . Meniscus repair  11/08    right knee  . Total knee arthroplasty  05/09    after CVA  . Cataract extraction      left eye    Family History  Problem Relation Age of Onset  . Coronary artery disease Mother   . Breast cancer Mother   . Cancer Mother     breast cancer, ovarian cancer  . Heart failure Mother   . Breast cancer Sister   . Cancer Sister     breast cancer  . Breast cancer Maternal Aunt   . Cancer Maternal Aunt     breast cancer  . Breast cancer Maternal Grandmother   . Cancer Maternal Grandmother     breast cancer    History   Social History  . Marital Status: Single    Spouse Name: N/A    Number of Children: 5  . Years of Education: N/A   Occupational History  . DISABLED    Social History Main Topics  . Smoking status: Former Smoker    Types: Cigarettes    Quit date: 10/21/2005  . Smokeless tobacco:  Never Used  . Alcohol Use: No  . Drug Use: No  . Sexual Activity: Not on file   Other Topics Concern  . Not on file   Social History Narrative   Has living will   Sister Joanna Puff or daughter (at [place]) is her health care POA.    Would accept brief resuscitation attempts but no prolonged artificial ventilation.   No tube feeds if cognitively unaware   Review of Systems Still with sinus drainage Not sleeping well---relates to the leg pain     Objective:   Physical Exam  Constitutional: She appears well-developed and well-nourished. No distress.  Neck: Normal range of motion. Neck supple.  Cardiovascular: Normal rate, regular rhythm, normal heart sounds and intact distal pulses.  Exam reveals no gallop.   No murmur heard. Pulmonary/Chest: Effort normal and breath sounds normal. No respiratory distress. She has no wheezes. She has no rales.  Musculoskeletal: She exhibits no edema and no tenderness.  Lymphadenopathy:    She has no cervical adenopathy.  Neurological:  Normal sensation in plantar feet  Skin:  No foot lesions  Psychiatric: She has a normal mood and affect. Her behavior is normal.          Assessment & Plan:

## 2013-09-27 NOTE — Assessment & Plan Note (Signed)
She will try off the statin---to see if it affects her legs

## 2013-09-27 NOTE — Assessment & Plan Note (Signed)
Recent family stress Not really depressed

## 2013-09-27 NOTE — Assessment & Plan Note (Signed)
Control is marginal If still over 8%, will add low dose glipizide Discussed lifestyle--she has all her diet info from classes

## 2013-09-27 NOTE — Patient Instructions (Signed)
Please try off the simvastatin for 2-3 weeks. If you leg cramps go away, stay off it. If no change, go back on it.  Diabetes and Exercise Exercising regularly is important. It is not just about losing weight. It has many health benefits, such as:  Improving your overall fitness, flexibility, and endurance.  Increasing your bone density.  Helping with weight control.  Decreasing your body fat.  Increasing your muscle strength.  Reducing stress and tension.  Improving your overall health. People with diabetes who exercise gain additional benefits because exercise:  Reduces appetite.  Improves the body's use of blood sugar (glucose).  Helps lower or control blood glucose.  Decreases blood pressure.  Helps control blood lipids (such as cholesterol and triglycerides).  Improves the body's use of the hormone insulin by:  Increasing the body's insulin sensitivity.  Reducing the body's insulin needs.  Decreases the risk for heart disease because exercising:  Lowers cholesterol and triglycerides levels.  Increases the levels of good cholesterol (such as high-density lipoproteins [HDL]) in the body.  Lowers blood glucose levels. YOUR ACTIVITY PLAN  Choose an activity that you enjoy and set realistic goals. Your health care provider or diabetes educator can help you make an activity plan that works for you. You can break activities into 2 or 3 sessions throughout the day. Doing so is as good as one long session. Exercise ideas include:  Taking the dog for a walk.  Taking the stairs instead of the elevator.  Dancing to your favorite song.  Doing your favorite exercise with a friend. RECOMMENDATIONS FOR EXERCISING WITH TYPE 1 OR TYPE 2 DIABETES   Check your blood glucose before exercising. If blood glucose levels are greater than 240 mg/dL, check for urine ketones. Do not exercise if ketones are present.  Avoid injecting insulin into areas of the body that are going to be  exercised. For example, avoid injecting insulin into:  The arms when playing tennis.  The legs when jogging.  Keep a record of:  Food intake before and after you exercise.  Expected peak times of insulin action.  Blood glucose levels before and after you exercise.  The type and amount of exercise you have done.  Review your records with your health care provider. Your health care provider will help you to develop guidelines for adjusting food intake and insulin amounts before and after exercising.  If you take insulin or oral hypoglycemic agents, watch for signs and symptoms of hypoglycemia. They include:  Dizziness.  Shaking.  Sweating.  Chills.  Confusion.  Drink plenty of water while you exercise to prevent dehydration or heat stroke. Body water is lost during exercise and must be replaced.  Talk to your health care provider before starting an exercise program to make sure it is safe for you. Remember, almost any type of activity is better than none. Document Released: 12/28/2003 Document Revised: 06/09/2013 Document Reviewed: 03/16/2013 [facility] Patient Information 2014 [place], [place].

## 2013-09-28 LAB — HEMOGLOBIN A1C: Hgb A1c MFr Bld: 8.3 % — ABNORMAL HIGH (ref 4.6–6.5)

## 2013-10-01 ENCOUNTER — Other Ambulatory Visit: Payer: Self-pay | Admitting: *Deleted

## 2013-10-01 ENCOUNTER — Encounter: Payer: Self-pay | Admitting: *Deleted

## 2013-10-01 MED ORDER — GLIPIZIDE 2.5 MG HALF TABLET: 2.5000 mg | ORAL_TABLET | Freq: Every day | ORAL | Status: DC

## 2013-10-01 NOTE — Telephone Encounter (Signed)
rx faxed to pharmacy manually For glipizide 2.5mg , not sure why it won't go normal

## 2013-10-09 ENCOUNTER — Other Ambulatory Visit: Payer: Self-pay | Admitting: Internal Medicine

## 2013-10-11 NOTE — Telephone Encounter (Signed)
Okay #90 x 0 

## 2013-10-11 NOTE — Telephone Encounter (Signed)
rx called into pharmacy

## 2013-10-11 NOTE — Telephone Encounter (Signed)
Last filled 09/09/13. 

## 2013-10-23 DIAGNOSIS — J019 Acute sinusitis, unspecified: Secondary | ICD-10-CM | POA: Diagnosis not present

## 2013-11-09 ENCOUNTER — Other Ambulatory Visit: Payer: Self-pay | Admitting: Internal Medicine

## 2013-11-09 NOTE — Telephone Encounter (Signed)
Last filled 10/09/13

## 2013-11-10 NOTE — Telephone Encounter (Signed)
Okay #90 x 0 

## 2013-11-10 NOTE — Telephone Encounter (Signed)
rx called into pharmacy

## 2013-11-22 ENCOUNTER — Telehealth: Payer: Self-pay | Admitting: *Deleted

## 2013-11-22 NOTE — Telephone Encounter (Signed)
Reasonable for now, continue as is.  Have her send a copy of her sugars back next week for Dr. Silvio Pate to check.  Thanks.

## 2013-11-22 NOTE — Telephone Encounter (Signed)
Patient notified as instructed by telephone. 

## 2013-11-22 NOTE — Telephone Encounter (Signed)
Pt states since being put the increased dose of Glipizide, her blood sugar has been running low, she's been taking a half-tablet of Metformin and only taking the glipizide as needed. I advised Dr. Silvio Pate is out this week and she states she will continue to monitor her bs and continue doing this unless it's wrong.

## 2013-11-29 NOTE — Telephone Encounter (Signed)
Please tell her not to cut down on the metformin--that usually doesn't cause the low sugar reactions. She can try just 1/2 of the glipizide to see if that helps her sugars without causing the low sugar reactions. Her sugar numbers are actually very good--- I just don't want her to have the low sugar feeling

## 2013-11-29 NOTE — Telephone Encounter (Signed)
Pt called to leave FBS readings; 11/22/13 FBS 117; 11/23/13 FBS 127; 11/24/13 FBS 96; 11/25/13 FBS 92; 11/26/13 FBS 83; 11/27/13 FBS 104; 11/28/13 FBS 98 and today FBS 109. Pt said when FBS in 80s feels weak and dizzy but after eats breakfast does OK. Today pt feels fine. Please advise.Pt request cb with further instructions.

## 2013-12-01 ENCOUNTER — Other Ambulatory Visit: Payer: Self-pay | Admitting: Internal Medicine

## 2013-12-01 NOTE — Telephone Encounter (Signed)
Left a detailed message on home answering machine and advised pt to call if any questions

## 2013-12-10 ENCOUNTER — Other Ambulatory Visit: Payer: Self-pay | Admitting: Internal Medicine

## 2013-12-10 NOTE — Telephone Encounter (Signed)
Please finish note--- Sheila Cunningham phoned in

## 2013-12-10 NOTE — Telephone Encounter (Signed)
11/09/13 

## 2013-12-10 NOTE — Telephone Encounter (Signed)
Phoned in to pharmacy. 

## 2013-12-10 NOTE — Addendum Note (Signed)
Addended by: Ander Gaster B on: 12/10/2013 03:24 PM   Modules accepted: Orders

## 2013-12-10 NOTE — Telephone Encounter (Signed)
Okay #90 x 0 

## 2014-01-05 NOTE — Telephone Encounter (Signed)
Have her stop the glipizide completely and let me know if that doesn't stop the low sugar reactions

## 2014-01-05 NOTE — Telephone Encounter (Signed)
Pt 1/2 glipizide but is still running low BS and pt feels lightheaded and dizzy especially when gets up out of chair. 01/03/14 FBS 83,  01/04/14 FBS 83 and then 01/05/14 FBS 107.  Pt said last time BS was low on 01/02/14 at night and pt could not get up to take BS because she thought she would fall if she had gotten out of bed; pt lives by herself. Pt drinks orange juice when BS is low. Pt wants to know what to do. [facility].pt request cb.

## 2014-01-05 NOTE — Telephone Encounter (Signed)
Spoke with patient and advised results   

## 2014-01-07 ENCOUNTER — Other Ambulatory Visit: Payer: Self-pay | Admitting: Internal Medicine

## 2014-01-07 ENCOUNTER — Ambulatory Visit (INDEPENDENT_AMBULATORY_CARE_PROVIDER_SITE_OTHER): Payer: Medicare Other | Admitting: Family Medicine

## 2014-01-07 ENCOUNTER — Encounter: Payer: Self-pay | Admitting: Family Medicine

## 2014-01-07 ENCOUNTER — Telehealth: Payer: Self-pay | Admitting: Internal Medicine

## 2014-01-07 ENCOUNTER — Telehealth: Payer: Self-pay

## 2014-01-07 VITALS — BP 110/66 | HR 108 | Temp 98.4°F | Resp 16 | Wt 214.4 lb

## 2014-01-07 DIAGNOSIS — I951 Orthostatic hypotension: Secondary | ICD-10-CM | POA: Diagnosis not present

## 2014-01-07 DIAGNOSIS — R42 Dizziness and giddiness: Secondary | ICD-10-CM

## 2014-01-07 DIAGNOSIS — E119 Type 2 diabetes mellitus without complications: Secondary | ICD-10-CM

## 2014-01-07 LAB — CBC WITH DIFFERENTIAL/PLATELET
BASOS PCT: 1 % (ref 0–1)
Basophils Absolute: 0.1 10*3/uL (ref 0.0–0.1)
EOS ABS: 0.4 10*3/uL (ref 0.0–0.7)
Eosinophils Relative: 3 % (ref 0–5)
HCT: 40.5 % (ref 36.0–46.0)
HEMOGLOBIN: 13.9 g/dL (ref 12.0–15.0)
Lymphocytes Relative: 38 % (ref 12–46)
Lymphs Abs: 4.5 10*3/uL — ABNORMAL HIGH (ref 0.7–4.0)
MCH: 28.3 pg (ref 26.0–34.0)
MCHC: 34.3 g/dL (ref 30.0–36.0)
MCV: 82.5 fL (ref 78.0–100.0)
MONO ABS: 1 10*3/uL (ref 0.1–1.0)
MONOS PCT: 8 % (ref 3–12)
NEUTROS PCT: 50 % (ref 43–77)
Neutro Abs: 6 10*3/uL (ref 1.7–7.7)
Platelets: 373 10*3/uL (ref 150–400)
RBC: 4.91 MIL/uL (ref 3.87–5.11)
RDW: 14.7 % (ref 11.5–15.5)
WBC: 11.9 10*3/uL — ABNORMAL HIGH (ref 4.0–10.5)

## 2014-01-07 LAB — BASIC METABOLIC PANEL
BUN: 19 mg/dL (ref 6–23)
CALCIUM: 10.1 mg/dL (ref 8.4–10.5)
CO2: 25 mEq/L (ref 19–32)
Chloride: 99 mEq/L (ref 96–112)
Creat: 0.74 mg/dL (ref 0.50–1.10)
GLUCOSE: 111 mg/dL — AB (ref 70–99)
Potassium: 4.1 mEq/L (ref 3.5–5.3)
Sodium: 138 mEq/L (ref 135–145)

## 2014-01-07 LAB — HEMOGLOBIN A1C
HEMOGLOBIN A1C: 6.9 % — AB (ref <5.7)
Mean Plasma Glucose: 151 mg/dL — ABNORMAL HIGH (ref <117)

## 2014-01-07 NOTE — Telephone Encounter (Signed)
Okay #90 x 0 

## 2014-01-07 NOTE — Patient Instructions (Signed)
Follow up with DR LETVAK the beginning of the week STOP the Maxzide DECREASE the Metformin to 500mg  twice daily (1/2 tab twice daily)- if your sugars remain below 125, STOP the med all together INCREASE your water intake Make sure you are eating regularly Change positions slowly!!!  Allow your body time to adjust We'll notify you of your lab results and make any changes if needed IF you have worsening dizziness, confusion, or any other concerns- GO TO THE ER! Hang in there!

## 2014-01-07 NOTE — Telephone Encounter (Signed)
12/10/2013 

## 2014-01-07 NOTE — Telephone Encounter (Signed)
Noted  Will await her eval

## 2014-01-07 NOTE — Progress Notes (Signed)
   Subjective:    Patient ID: Sheila Cunningham, female    DOB: [birth year], 78 y.o.   MRN: [id number]  HPI Dizziness- pt reports feeling 'bad for a couple of weeks'.  Has been having low sugars, 'blacked out one night while talking to my son- i don't know what happened'.  Stopped Glipizide 3 days ago and sugar continues to drop.  Still on Metformin 1000mg  BID.  On Maxzide for BP.  Pt is having dizziness w/ position changes- particularly sitting to standing.  Eating regularly.  Drinking lots of fluids.  Denies dysuria, frequency, urgency.  Sister is w/ pt today and reports that she frequently does not take good care of herself- doesn't eat regularly.   Review of Systems For ROS see HPI     Objective:   Physical Exam  Vitals reviewed. Constitutional: She is oriented to person, place, and time. She appears well-developed and well-nourished. No distress.  HENT:  Head: Normocephalic and atraumatic.  Eyes: Conjunctivae and EOM are normal. Pupils are equal, round, and reactive to light.  Neck: Normal range of motion. Neck supple. No thyromegaly present.  Cardiovascular: Normal rate, regular rhythm, normal heart sounds and intact distal pulses.   No murmur heard. Pulmonary/Chest: Effort normal and breath sounds normal. No respiratory distress.  Abdominal: Soft. She exhibits no distension. There is no tenderness.  Musculoskeletal: She exhibits no edema.  Lymphadenopathy:    She has no cervical adenopathy.  Neurological: She is alert and oriented to person, place, and time. No cranial nerve deficit. Coordination (dizziness w/ standing and walking) abnormal.  Skin: Skin is warm and dry.  Psychiatric: She has a normal mood and affect. Her behavior is normal.          Assessment & Plan:

## 2014-01-07 NOTE — Telephone Encounter (Signed)
Patient Information:  Caller Name: [place]  Phone: [phone]  Patient: Sheila Cunningham, Sheila Cunningham  Gender: Female  DOB: 12/14/1935  Age: 78 Years  PCP: Viviana Simpler [facility])  Office Follow Up:  Does the office need to follow up with this patient?: No  Instructions For The Office: N/A  RN Note:  No appt available at [facility]. Appt made with Dr Nancy Nordmann at [facility] at 1445. Care advice given with call back/Emergent parameters.  Symptoms  Reason For Call & Symptoms: Pt callling regarding lightheadedness and dizziness when standing. Blood sugar was 74 at 1200. Now blood sugar is 90 at 1303-after pt ate a brownie, donut and juice since 1130; pt is still sx. Pt takes Metformin. Lips are tingly when sugar goes low. Fasting wasw 107. Took pt off of Glipizide b/c it was dropping blood sugar too low (01/05/14). Pt states she lost consiousness on Tues night, 01/04/14.  Reviewed Health History In EMR: Yes  Reviewed Medications In EMR: Yes  Reviewed Allergies In EMR: Yes  Reviewed Surgeries / Procedures: Yes  Date of Onset of Symptoms: 01/07/2014  Treatments Tried: Ate brownie, donut, juice  Treatments Tried Worked: No  Guideline(s) Used:  Diabetes - Low Blood Sugar  Disposition Per Guideline:   Go to ED Now (or to Office with PCP Approval)  Reason For Disposition Reached:   Low blood sugar symptoms persist > 15 minutes AND using low blood sugar Care Advice  Advice Given:  Call Back If:  You have more questions  You become worse.  Patient Will Follow Care Advice:  YES  Appointment Scheduled:  01/07/2014 14:45:00 Appointment Scheduled Provider:  Birdie Riddle

## 2014-01-07 NOTE — Telephone Encounter (Signed)
Given her symptoms, patient was called to verify whether she was driving herself or if she had someone else to bring her in for her appt this afternoon.  Patient stated that a family member would be driving.

## 2014-01-07 NOTE — Telephone Encounter (Signed)
rx called into pharmacy

## 2014-01-07 NOTE — Progress Notes (Signed)
Pre visit review using our clinic review tool, if applicable. No additional management support is needed unless otherwise documented below in the visit note. 

## 2014-01-08 LAB — TSH: TSH: 2.723 u[IU]/mL (ref 0.350–4.500)

## 2014-01-09 NOTE — Assessment & Plan Note (Signed)
New.  Stop Maxzide.  Suspect pt is not drinking enough water.  Encouraged increased water, changing positions slowly.  Pt to f/u w/ PCP early next week.

## 2014-01-09 NOTE — Assessment & Plan Note (Signed)
Chronic problem.  Pt now having symptomatic lows.  She has already stopped her glipizide.  Will decrease metformin to 500mg  BID.  Pt to f/u w/ PCP

## 2014-01-09 NOTE — Assessment & Plan Note (Signed)
New.  Suspect this is due to combination of orthostatic hypotension and hypoglycemia.  Check labs to r/o thyroid abnormality or other metabolic causes.  Stop Maxzide.  Decrease Metformin to 500mg  BID.  Pt to f/u w/ PCP early next week but is aware that if sxs change or worsen over the weekend that she needs to go to ER

## 2014-01-10 ENCOUNTER — Ambulatory Visit (INDEPENDENT_AMBULATORY_CARE_PROVIDER_SITE_OTHER): Payer: Medicare Other | Admitting: Internal Medicine

## 2014-01-10 ENCOUNTER — Encounter: Payer: Self-pay | Admitting: Internal Medicine

## 2014-01-10 ENCOUNTER — Encounter: Payer: Self-pay | Admitting: General Practice

## 2014-01-10 VITALS — BP 120/80 | HR 81 | Temp 98.4°F | Wt 216.0 lb

## 2014-01-10 DIAGNOSIS — E119 Type 2 diabetes mellitus without complications: Secondary | ICD-10-CM

## 2014-01-10 DIAGNOSIS — I951 Orthostatic hypotension: Secondary | ICD-10-CM

## 2014-01-10 NOTE — Progress Notes (Signed)
Pre visit review using our clinic review tool, if applicable. No additional management support is needed unless otherwise documented below in the visit note. 

## 2014-01-10 NOTE — Patient Instructions (Signed)
Please stay off the maxzide (triamterene/HCTZ0 Go back up to 1000mg  twice of day of the metformin. Let me know if the blood pressure is higher than 160/100 or the sugars stay consistently up

## 2014-01-10 NOTE — Progress Notes (Signed)
Subjective:    Patient ID: Sheila Cunningham, female    DOB: 09-16-1936, 78 y.o.   MRN: [id number]  HPI Here with sister BP was dropping and she was orthostatic Now seems better off the maxzide Couldn't walk or drive  Sugar went up to 458 yesterday 110 this AM A1c was down on Friday Had been eating better, then adding sweets to counteract low sugar reactions on the glipizide Glipizide stopped 3/18  BP 155/91 sitting Standing 153/95  Current Outpatient Prescriptions on File Prior to Visit  Medication Sig Dispense Refill  . aspirin 325 MG tablet Take 325 mg by mouth daily.        Marland Kitchen azelastine (ASTELIN) 137 MCG/SPRAY nasal spray Place 1 spray into the nose 2 (two) times daily. Use in each nostril as directed  30 mL  11  . Cholecalciferol (VITAMIN D3) 2000 UNITS capsule Take 2,000 Units by mouth daily.      . clorazepate (TRANXENE) 7.5 MG tablet TAKE 1 TABLET BY MOUTH 3 TIMES A DAY AS NEEDED FOR ANXIETY  90 tablet  0  . cyclobenzaprine (FLEXERIL) 5 MG tablet Take 5 mg by mouth daily as needed.        Marland Kitchen glucose blood (TRUETEST TEST) test strip Used to test blood sugar once daily dx: 250.00  100 each  2  . KLOR-CON M20 20 MEQ tablet TAKE 2 TABLETS BY MOUTH ONCE A DAY  60 tablet  11  . levothyroxine (SYNTHROID, LEVOTHROID) 25 MCG tablet TAKE 1 TABLET BY MOUTH EVERY DAY  30 tablet  11  . loratadine (CLARITIN) 10 MG tablet Take 10 mg by mouth daily.        Marland Kitchen omeprazole (PRILOSEC) 20 MG capsule Take 1 capsule (20 mg total) by mouth daily.  90 capsule  3  . vitamin B-12 (CYANOCOBALAMIN) 1000 MCG tablet Take 2,000 mcg by mouth daily.        No current facility-administered medications on file prior to visit.    Allergies  Allergen Reactions  . Ace Inhibitors     REACTION: unspecified  . Alprazolam     REACTION: unspecified  . Amlodipine Besylate     REACTION: puffy eyes and hands  . Bupropion Hcl     REACTION: At high dose: Ill, hateful, sleepy  . Citalopram Hydrobromide    REACTION: unspecified  . Codeine Phosphate     REACTION: unspecified  . Fluoxetine Hcl     REACTION: "too strong"  . Glipizide     Hypoglycemic reactions    Past Medical History  Diagnosis Date  . Allergy   . Anxiety   . Depression   . Diabetes mellitus   . GERD (gastroesophageal reflux disease)   . Hypertension   . Hyperlipidemia   . Thyroid disease   . Stroke     Past Surgical History  Procedure Laterality Date  . Cholecystectomy    . Tonsillectomy    . Vaginal delivery      x5  . Tubal ligation    . Endoscopic vein laser treatment  2006  . Meniscus repair  11/08    right knee  . Total knee arthroplasty  05/09    after CVA  . Cataract extraction      left eye    Family History  Problem Relation Age of Onset  . Coronary artery disease Mother   . Breast cancer Mother   . Cancer Mother     breast cancer, ovarian cancer  . Heart  failure Mother   . Breast cancer Sister   . Cancer Sister     breast cancer  . Breast cancer Maternal Aunt   . Cancer Maternal Aunt     breast cancer  . Breast cancer Maternal Grandmother   . Cancer Maternal Grandmother     breast cancer    History   Social History  . Marital Status: Single    Spouse Name: N/A    Number of Children: 5  . Years of Education: N/A   Occupational History  . DISABLED    Social History Main Topics  . Smoking status: Former Smoker    Types: Cigarettes    Quit date: 10/21/2005  . Smokeless tobacco: Never Used  . Alcohol Use: No  . Drug Use: No  . Sexual Activity: Not on file   Other Topics Concern  . Not on file   Social History Narrative   Has living will   Sister [name] or daughter (at [employer]) is her health care POA.    Would accept brief resuscitation attempts but no prolonged artificial ventilation.   No tube feeds if cognitively unaware   Review of Systems No chest pain No SOB now---- felt bad when BP dropped so low    Objective:   Physical Exam  Constitutional: She  appears well-developed and well-nourished. No distress.  Neck: Normal range of motion. Neck supple. No thyromegaly present.  Cardiovascular: Normal rate, regular rhythm and normal heart sounds.  Exam reveals no gallop.   No murmur heard. Pulmonary/Chest: Effort normal and breath sounds normal. No respiratory distress. She has no wheezes. She has no rales.  Musculoskeletal: She exhibits no edema.  Lymphadenopathy:    She has no cervical adenopathy.          Assessment & Plan:

## 2014-01-10 NOTE — Assessment & Plan Note (Signed)
Lab Results  Component Value Date   HGBA1C 6.9* 01/07/2014   Much better on the glipizide but at the cost of hypoglycemic reactions Will stop this Told her to go back up to 1000mg  bid

## 2014-01-10 NOTE — Assessment & Plan Note (Signed)
Symptoms better BP on right 156/82 now Will stay off the maxzide Consider low dose losartan if her pressure goes up

## 2014-01-18 ENCOUNTER — Telehealth: Payer: Self-pay

## 2014-01-18 NOTE — Telephone Encounter (Signed)
Spoke with patient and she did take a maxzide, her BP is now 180/90, per pt her sister is out of town and she will call if anything changes, she's still a little dizzy but better

## 2014-01-18 NOTE — Telephone Encounter (Signed)
Pt said was seen 01/10/14 and stopped maxzide as instructed. Pt only takes BP once a week and this AM BP 200/100, pt laid down to rest and now BP 188/99. Pt is dizzy when gets up but no H/A, CP or SOB. Pt request cb.[facility]. If pt condition changes or worsens prior to cb pt will call office.

## 2014-01-18 NOTE — Telephone Encounter (Signed)
.  left message to have patient return my call.  

## 2014-01-19 NOTE — Telephone Encounter (Signed)
Spoke with patient and she feels a little better and her BP is coming down, she will call if anything changes

## 2014-01-19 NOTE — Telephone Encounter (Signed)
Have her restart and stay on the maxzide---but let me know if she has anymore dizziness Tell her to take it slow when she first stands up, and hold onto something for a minute before moving---just in case

## 2014-01-20 ENCOUNTER — Telehealth: Payer: Self-pay

## 2014-01-20 NOTE — Telephone Encounter (Signed)
Relevant patient education mailed to patient.  

## 2014-02-09 ENCOUNTER — Other Ambulatory Visit: Payer: Self-pay | Admitting: Internal Medicine

## 2014-02-09 NOTE — Telephone Encounter (Signed)
01/07/14  

## 2014-02-09 NOTE — Telephone Encounter (Signed)
Okay #90 x 0 

## 2014-02-09 NOTE — Telephone Encounter (Signed)
rx called into pharmacy

## 2014-02-21 LAB — HM DIABETES EYE EXAM

## 2014-03-04 DIAGNOSIS — E119 Type 2 diabetes mellitus without complications: Secondary | ICD-10-CM | POA: Diagnosis not present

## 2014-03-10 ENCOUNTER — Other Ambulatory Visit: Payer: Self-pay | Admitting: Internal Medicine

## 2014-03-10 NOTE — Telephone Encounter (Signed)
rx called into pharmacy

## 2014-03-10 NOTE — Telephone Encounter (Signed)
02/09/2014 

## 2014-03-10 NOTE — Telephone Encounter (Signed)
Okay #90 x 0 

## 2014-03-12 ENCOUNTER — Other Ambulatory Visit: Payer: Self-pay | Admitting: Internal Medicine

## 2014-03-21 ENCOUNTER — Other Ambulatory Visit: Payer: Self-pay | Admitting: Internal Medicine

## 2014-03-22 ENCOUNTER — Encounter: Payer: Self-pay | Admitting: Internal Medicine

## 2014-03-23 ENCOUNTER — Ambulatory Visit (INDEPENDENT_AMBULATORY_CARE_PROVIDER_SITE_OTHER): Payer: Medicare Other | Admitting: Internal Medicine

## 2014-03-23 ENCOUNTER — Encounter: Payer: Self-pay | Admitting: Internal Medicine

## 2014-03-23 ENCOUNTER — Telehealth: Payer: Self-pay

## 2014-03-23 VITALS — BP 124/78 | HR 71 | Temp 97.9°F | Wt 216.8 lb

## 2014-03-23 DIAGNOSIS — L293 Anogenital pruritus, unspecified: Secondary | ICD-10-CM | POA: Diagnosis not present

## 2014-03-23 DIAGNOSIS — L290 Pruritus ani: Secondary | ICD-10-CM

## 2014-03-23 DIAGNOSIS — R42 Dizziness and giddiness: Secondary | ICD-10-CM

## 2014-03-23 DIAGNOSIS — I951 Orthostatic hypotension: Secondary | ICD-10-CM

## 2014-03-23 DIAGNOSIS — J019 Acute sinusitis, unspecified: Secondary | ICD-10-CM

## 2014-03-23 DIAGNOSIS — N898 Other specified noninflammatory disorders of vagina: Secondary | ICD-10-CM

## 2014-03-23 DIAGNOSIS — K644 Residual hemorrhoidal skin tags: Secondary | ICD-10-CM

## 2014-03-23 MED ORDER — AMOXICILLIN 500 MG PO CAPS: 500.0000 mg | ORAL_CAPSULE | Freq: Three times a day (TID) | ORAL | Status: DC

## 2014-03-23 MED ORDER — HYDROCORTISONE 2.5 % RE CREA: 1.0000 "application " | TOPICAL_CREAM | Freq: Two times a day (BID) | RECTAL | Status: DC

## 2014-03-23 NOTE — Progress Notes (Signed)
Subjective:    Patient ID: Sheila Cunningham, female    DOB: [birth year], 78 y.o.   MRN: [id number]  HPI  Pt presents to the clinic today with c/o dizziness and pounding headache. She reports this occurs when she moves from a sitting to a standing position. She was seen for the same 01/07/14. She was found to be orthostatic. Her Maxide was stopped. She is not medicated for her BP at this time. Her blood pressure today is normal. She is drinking plenty of fluids.   Additionally, she c/o itching and burning in the vaginal/anal area. She denies urinary symptoms. She does have an external hemorrhoid. She has been using Prep H cream without relief.  She does report sinus pain and pressure. She reports this started 2 weeks ago. She does report post nasal drip and sore throat. She denies nasal discharge, fever, chills or body aches. She is taking claritin and nasal spray with some relief.  Review of Systems      Past Medical History  Diagnosis Date  . Allergy   . Anxiety   . Depression   . Diabetes mellitus   . GERD (gastroesophageal reflux disease)   . Hypertension   . Hyperlipidemia   . Thyroid disease   . Stroke     Current Outpatient Prescriptions  Medication Sig Dispense Refill  . Ascorbic Acid (VITAMIN C) 1000 MG tablet Take 1,000 mg by mouth daily.      Marland Kitchen aspirin 325 MG tablet Take 325 mg by mouth daily.        Marland Kitchen azelastine (ASTELIN) 137 MCG/SPRAY nasal spray Place 1 spray into the nose 2 (two) times daily. Use in each nostril as directed  30 mL  11  . Cholecalciferol (VITAMIN D3) 2000 UNITS capsule Take 2,000 Units by mouth daily.      . clorazepate (TRANXENE) 7.5 MG tablet TAKE 1 TABLET BY MOUTH 3 TIMES A DAY AS NEEDED ANXIETY  90 tablet  0  . cyclobenzaprine (FLEXERIL) 5 MG tablet Take 5 mg by mouth daily as needed.        Marland Kitchen glucose blood (TRUETEST TEST) test strip Used to test blood sugar once daily dx: 250.00  100 each  2  . KLOR-CON M20 20 MEQ tablet TAKE 2 TABLETS BY MOUTH  ONCE A DAY  60 tablet  11  . levothyroxine (SYNTHROID, LEVOTHROID) 25 MCG tablet TAKE 1 TABLET BY MOUTH EVERY DAY  30 tablet  11  . loratadine (CLARITIN) 10 MG tablet Take 10 mg by mouth daily.        . metFORMIN (GLUCOPHAGE) 1000 MG tablet TAKE 1 TABLET BY MOUTH TWICE A DAY WITH MEALS  180 tablet  3  . omeprazole (PRILOSEC) 20 MG capsule TAKE ONE CAPSULE BY MOUTH ONCE A DAY  90 capsule  3  . triamterene-hydrochlorothiazide (MAXZIDE-25) 37.5-25 MG per tablet TAKE 1 TABLET EVERY DAY  90 tablet  3  . vitamin B-12 (CYANOCOBALAMIN) 1000 MCG tablet Take 2,000 mcg by mouth daily.       . simvastatin (ZOCOR) 40 MG tablet TAKE 1/2 TABLET BY MOUTH AT BEDTIME  30 tablet  0   No current facility-administered medications for this visit.    Allergies  Allergen Reactions  . Ace Inhibitors     REACTION: unspecified  . Amlodipine Besylate     REACTION: puffy eyes and hands  . Bupropion Hcl     REACTION: At high dose: Ill, hateful, sleepy  . Citalopram Hydrobromide  REACTION: unspecified  . Codeine Phosphate     REACTION: unspecified  . Fluoxetine Hcl     REACTION: "too strong"  . Glipizide     Hypoglycemic reactions    Family History  Problem Relation Age of Onset  . Coronary artery disease Mother   . Breast cancer Mother   . Cancer Mother     breast cancer, ovarian cancer  . Heart failure Mother   . Breast cancer Sister   . Cancer Sister     breast cancer  . Breast cancer Maternal Aunt   . Cancer Maternal Aunt     breast cancer  . Breast cancer Maternal Grandmother   . Cancer Maternal Grandmother     breast cancer    History   Social History  . Marital Status: Single    Spouse Name: N/A    Number of Children: 5  . Years of Education: N/A   Occupational History  . DISABLED    Social History Main Topics  . Smoking status: Former Smoker    Types: Cigarettes    Quit date: 10/21/2005  . Smokeless tobacco: Never Used  . Alcohol Use: No  . Drug Use: No  . Sexual  Activity: Not on file   Other Topics Concern  . Not on file   Social History Narrative   Has living will   Sister [name] or daughter (at [employer]) is her health care POA.    Would accept brief resuscitation attempts but no prolonged artificial ventilation.   No tube feeds if cognitively unaware     Constitutional: Denies fever, malaise, fatigue, headache or abrupt weight changes.  ENT: Pt reports nasal congestion, sore throat and ear fullness. Respiratory: Pt reports cough. Denies difficulty breathing, shortness of breath, or sputum production.   Cardiovascular: Denies chest pain, chest tightness, palpitations or swelling in the hands or feet.   Skin: Pt reports vaginal and anal itching. Denies redness, rashes, lesions or ulcercations.  Neurological: Pt reports dizziness. Denies difficulty with memory, difficulty with speech or problems with balance and coordination.   No other specific complaints in a complete review of systems (except as listed in HPI above).  Objective:   Physical Exam   BP 124/78  Pulse 71  Temp(Src) 97.9 F (36.6 C) (Oral)  Wt 216 lb 12 oz (98.317 kg)  SpO2 98% Wt Readings from Last 3 Encounters:  03/23/14 216 lb 12 oz (98.317 kg)  01/10/14 216 lb (97.977 kg)  01/07/14 214 lb 6 oz (97.24 kg)    General: Appears her stated age, chronically ill appearing in NAD. HEENT: Sinus tenderness noted. Nasal turbinates erythematous. No lymphadenopathy noted. Throat erythematous. + PND. Cardiovascular: Normal rate and rhythm. S1,S2 noted.  No murmur, rubs or gallops noted. No JVD or BLE edema. No carotid bruits noted. Pulmonary/Chest: Normal effort and positive vesicular breath sounds. No respiratory distress. No wheezes, rales or ronchi noted.  Pelvic: She declines today. Neurological: Alert and oriented. Cranial nerves grossly intact. Coordination normal. +DTRs bilaterally.   BMET    Component Value Date/Time   NA 138 01/07/2014 1549   K 4.1 01/07/2014  1549   CL 99 01/07/2014 1549   CO2 25 01/07/2014 1549   GLUCOSE 111* 01/07/2014 1549   BUN 19 01/07/2014 1549   CREATININE 0.74 01/07/2014 1549   CREATININE 0.8 03/23/2012 1240   CALCIUM 10.1 01/07/2014 1549   GFRNONAA 65.58 10/02/2009 1619   GFRAA 71 06/15/2008 1056    Lipid Panel  Component Value Date/Time   CHOL 188 03/26/2013 1510   TRIG 144 03/26/2013 1510   HDL 58 03/26/2013 1510   CHOLHDL 3.2 03/26/2013 1510   VLDL 29 03/26/2013 1510   LDLCALC 101* 03/26/2013 1510    CBC    Component Value Date/Time   WBC 11.9* 01/07/2014 1549   RBC 4.91 01/07/2014 1549   HGB 13.9 01/07/2014 1549   HCT 40.5 01/07/2014 1549   PLT 373 01/07/2014 1549   MCV 82.5 01/07/2014 1549   MCH 28.3 01/07/2014 1549   MCHC 34.3 01/07/2014 1549   RDW 14.7 01/07/2014 1549   LYMPHSABS 4.5* 01/07/2014 1549   MONOABS 1.0 01/07/2014 1549   EOSABS 0.4 01/07/2014 1549   BASOSABS 0.1 01/07/2014 1549    Hgb A1C Lab Results  Component Value Date   HGBA1C 6.9* 01/07/2014        Assessment & Plan:  External Hemorrhoid:  She is certain the itching is coming from her hemorrhoid eRx for anusol cream to affected area  Acute sinusitis:  eRx for amoxil TID x 10 days Try flonase OTC  RTC as needed or if symptoms persist or worsen

## 2014-03-23 NOTE — Progress Notes (Signed)
Pre visit review using our clinic review tool, if applicable. No additional management support is needed unless otherwise documented below in the visit note. 

## 2014-03-23 NOTE — Telephone Encounter (Signed)
Pt scheduled appt with Dr Silvio Pate on 03/24/14 but pt is very concerned with how she feels; since 03/19/14 pt having lightheadedness especially when gets up out of chair and h/a; advised pt to get up slowly. Pt said she cannot explain exact symptoms but pt does not "feel right". Pt had CVA in 2009 and pt not having any changes in ability to walk or grasp things with her hands, no new weakness, vision changes, CP or SOB. Pts FBS this AM was 121. Pt scheduled appt to see Webb Silversmith NP today at 3 pm. Pt does not feel safe to drive so pt thinks she can get a ride to office today but is leaving 03/24/14 appt with Dr Silvio Pate until confirm transportation for today.

## 2014-03-23 NOTE — Patient Instructions (Addendum)

## 2014-03-24 ENCOUNTER — Ambulatory Visit: Payer: Medicare Other | Admitting: Internal Medicine

## 2014-04-12 ENCOUNTER — Ambulatory Visit (INDEPENDENT_AMBULATORY_CARE_PROVIDER_SITE_OTHER): Payer: Medicare Other | Admitting: Internal Medicine

## 2014-04-12 ENCOUNTER — Encounter: Payer: Self-pay | Admitting: Internal Medicine

## 2014-04-12 VITALS — BP 110/70 | HR 79 | Temp 98.4°F | Ht 64.0 in | Wt 218.0 lb

## 2014-04-12 DIAGNOSIS — Z0000 Encounter for general adult medical examination without abnormal findings: Secondary | ICD-10-CM | POA: Insufficient documentation

## 2014-04-12 DIAGNOSIS — E119 Type 2 diabetes mellitus without complications: Secondary | ICD-10-CM | POA: Diagnosis not present

## 2014-04-12 DIAGNOSIS — I10 Essential (primary) hypertension: Secondary | ICD-10-CM | POA: Diagnosis not present

## 2014-04-12 DIAGNOSIS — Z780 Asymptomatic menopausal state: Secondary | ICD-10-CM

## 2014-04-12 DIAGNOSIS — F39 Unspecified mood [affective] disorder: Secondary | ICD-10-CM

## 2014-04-12 DIAGNOSIS — Z23 Encounter for immunization: Secondary | ICD-10-CM | POA: Diagnosis not present

## 2014-04-12 DIAGNOSIS — I6990 Unspecified sequelae of unspecified cerebrovascular disease: Secondary | ICD-10-CM | POA: Diagnosis not present

## 2014-04-12 DIAGNOSIS — E785 Hyperlipidemia, unspecified: Secondary | ICD-10-CM

## 2014-04-12 DIAGNOSIS — E039 Hypothyroidism, unspecified: Secondary | ICD-10-CM

## 2014-04-12 LAB — COMPREHENSIVE METABOLIC PANEL
ALBUMIN: 4.5 g/dL (ref 3.5–5.2)
ALT: 23 U/L (ref 0–35)
AST: 41 U/L — ABNORMAL HIGH (ref 0–37)
Alkaline Phosphatase: 88 U/L (ref 39–117)
BILIRUBIN TOTAL: 0 mg/dL — AB (ref 0.2–1.2)
BUN: 22 mg/dL (ref 6–23)
CO2: 29 mEq/L (ref 19–32)
Calcium: 10.2 mg/dL (ref 8.4–10.5)
Chloride: 98 mEq/L (ref 96–112)
Creatinine, Ser: 0.9 mg/dL (ref 0.4–1.2)
GFR: 63.95 mL/min (ref >60.00)
GLUCOSE: 138 mg/dL — AB (ref 70–99)
Potassium: 4 mEq/L (ref 3.5–5.1)
Sodium: 136 mEq/L (ref 135–145)
TOTAL PROTEIN: 8 g/dL (ref 6.0–8.3)

## 2014-04-12 LAB — LIPID PANEL
CHOL/HDL RATIO: 4
Cholesterol: 218 mg/dL — ABNORMAL HIGH (ref 0–200)
HDL: 59.7 mg/dL (ref >39.00)
LDL CALC: 116 mg/dL — AB (ref 0–99)
NonHDL: 158.3
Triglycerides: 214 mg/dL — ABNORMAL HIGH (ref 0.0–149.0)
VLDL: 42.8 mg/dL — ABNORMAL HIGH (ref 0.0–40.0)

## 2014-04-12 LAB — HM DIABETES FOOT EXAM

## 2014-04-12 LAB — MICROALBUMIN / CREATININE URINE RATIO
CREATININE, U: 39.5 mg/dL
Microalb Creat Ratio: 0.5 mg/g (ref 0.0–30.0)
Microalb, Ur: 0.2 mg/dL (ref 0.0–1.9)

## 2014-04-12 LAB — TSH: TSH: 0.5 u[IU]/mL (ref 0.35–4.50)

## 2014-04-12 LAB — HEMOGLOBIN A1C: Hgb A1c MFr Bld: 8 % — ABNORMAL HIGH (ref 4.6–6.5)

## 2014-04-12 LAB — T4, FREE: Free T4: 0.66 ng/dL (ref 0.60–1.60)

## 2014-04-12 MED ORDER — ALPRAZOLAM 0.5 MG PO TABS: 0.2500 mg | ORAL_TABLET | Freq: Two times a day (BID) | ORAL | Status: DC | PRN

## 2014-04-12 NOTE — Patient Instructions (Signed)
Please set up your screening mammogram  Diabetes Mellitus and Food It is important for you to manage your blood sugar (glucose) level. Your blood glucose level can be greatly affected by what you eat. Eating healthier foods in the appropriate amounts throughout the day at about the same time each day will help you control your blood glucose level. It can also help slow or prevent worsening of your diabetes mellitus. Healthy eating may even help you improve the level of your blood pressure and reach or maintain a healthy weight.  HOW CAN FOOD AFFECT ME? Carbohydrates Carbohydrates affect your blood glucose level more than any other type of food. Your dietitian will help you determine how many carbohydrates to eat at each meal and teach you how to count carbohydrates. Counting carbohydrates is important to keep your blood glucose at a healthy level, especially if you are using insulin or taking certain medicines for diabetes mellitus. Alcohol Alcohol can cause sudden decreases in blood glucose (hypoglycemia), especially if you use insulin or take certain medicines for diabetes mellitus. Hypoglycemia can be a life-threatening condition. Symptoms of hypoglycemia (sleepiness, dizziness, and disorientation) are similar to symptoms of having too much alcohol.  If your health care provider has given you approval to drink alcohol, do so in moderation and use the following guidelines:  Women should not have more than one drink per day, and men should not have more than two drinks per day. One drink is equal to:  12 oz of beer.  5 oz of wine.  1 oz of hard liquor.  Do not drink on an empty stomach.  Keep yourself hydrated. Have water, diet soda, or unsweetened iced tea.  Regular soda, juice, and other mixers might contain a lot of carbohydrates and should be counted. WHAT FOODS ARE NOT RECOMMENDED? As you make food choices, it is important to remember that all foods are not the same. Some foods have  fewer nutrients per serving than other foods, even though they might have the same number of calories or carbohydrates. It is difficult to get your body what it needs when you eat foods with fewer nutrients. Examples of foods that you should avoid that are high in calories and carbohydrates but low in nutrients include:  Trans fats (most processed foods list trans fats on the Nutrition Facts label).  Regular soda.  Juice.  Candy.  Sweets, such as cake, pie, doughnuts, and cookies.  Fried foods. WHAT FOODS CAN I EAT? Have nutrient-rich foods, which will nourish your body and keep you healthy. The food you should eat also will depend on several factors, including:  The calories you need.  The medicines you take.  Your weight.  Your blood glucose level.  Your blood pressure level.  Your cholesterol level. You also should eat a variety of foods, including:  Protein, such as meat, poultry, fish, tofu, nuts, and seeds (lean animal proteins are best).  Fruits.  Vegetables.  Dairy products, such as milk, cheese, and yogurt (low fat is best).  Breads, grains, pasta, cereal, rice, and beans.  Fats such as olive oil, trans fat-free margarine, canola oil, avocado, and olives. DOES EVERYONE WITH DIABETES MELLITUS HAVE THE SAME MEAL PLAN? Because every person with diabetes mellitus is different, there is not one meal plan that works for everyone. It is very important that you meet with a dietitian who will help you create a meal plan that is just right for you. Document Released: 07/04/2005 Document Revised: 10/12/2013 Document Reviewed: 09/03/2013  [employer] Patient Information 2015 [employer]. This information is not intended to replace advice given to you by your health care provider. Make sure you discuss any questions you have with your health care provider.  

## 2014-04-12 NOTE — Progress Notes (Signed)
Subjective:    Patient ID: Sheila Cunningham, female    DOB: [birth year], 78 y.o.   MRN: [id number]  HPI Here for Medicare wellness and follow up Reviewed form and advanced directives Does some exercise Hearing and vision are okay--mild tinnitus and hearing loss but not serious No tobacco or alcohol Reviewed her other physicians No cognitive problems Independent with instrumental ADLs-- but sister does her bills Still wants to do yearly mammograms due to strong FH of breast cancer No falls  Checks sugars daily 98-130's fasting No hypoglycemic reactions No sores or pain in feet Recent eye exam was fine  Did restart the diuretic BP has been fine No sig orthostatic hypotension symptoms No chest pain No SOB  Stress with family Daughter and SIL need surgery Anxiety and dysthymia still  Uses the clorazepate but doesn't seem to be helping Wants to change to alprazolam which she thinks worked better  Ongoing stomach troubles Lots of gas External hemorrhoid Has appt with Dr Henrene Pastor to investigate all this Does have good daily BM  Current Outpatient Prescriptions on File Prior to Visit  Medication Sig Dispense Refill  . Ascorbic Acid (VITAMIN C) 1000 MG tablet Take 1,000 mg by mouth daily.      Marland Kitchen aspirin 325 MG tablet Take 325 mg by mouth daily.        Marland Kitchen azelastine (ASTELIN) 137 MCG/SPRAY nasal spray Place 1 spray into the nose 2 (two) times daily. Use in each nostril as directed  30 mL  11  . Cholecalciferol (VITAMIN D3) 2000 UNITS capsule Take 2,000 Units by mouth daily.      . clorazepate (TRANXENE) 7.5 MG tablet TAKE 1 TABLET BY MOUTH 3 TIMES A DAY AS NEEDED ANXIETY  90 tablet  0  . cyclobenzaprine (FLEXERIL) 5 MG tablet Take 5 mg by mouth daily as needed.        Marland Kitchen glucose blood (TRUETEST TEST) test strip Used to test blood sugar once daily dx: 250.00  100 each  2  . hydrocortisone (ANUSOL-HC) 2.5 % rectal cream Place 1 application rectally 2 (two) times daily.  30 g  0  .  KLOR-CON M20 20 MEQ tablet TAKE 2 TABLETS BY MOUTH ONCE A DAY  60 tablet  11  . levothyroxine (SYNTHROID, LEVOTHROID) 25 MCG tablet TAKE 1 TABLET BY MOUTH EVERY DAY  30 tablet  11  . loratadine (CLARITIN) 10 MG tablet Take 10 mg by mouth daily.        . metFORMIN (GLUCOPHAGE) 1000 MG tablet TAKE 1 TABLET BY MOUTH TWICE A DAY WITH MEALS  180 tablet  3  . omeprazole (PRILOSEC) 20 MG capsule TAKE ONE CAPSULE BY MOUTH ONCE A DAY  90 capsule  3  . triamterene-hydrochlorothiazide (MAXZIDE-25) 37.5-25 MG per tablet TAKE 1 TABLET EVERY DAY  90 tablet  3  . vitamin B-12 (CYANOCOBALAMIN) 1000 MCG tablet Take 2,000 mcg by mouth daily.        No current facility-administered medications on file prior to visit.    Allergies  Allergen Reactions  . Ace Inhibitors     REACTION: unspecified  . Amlodipine Besylate     REACTION: puffy eyes and hands  . Bupropion Hcl     REACTION: At high dose: Ill, hateful, sleepy  . Citalopram Hydrobromide     REACTION: unspecified  . Codeine Phosphate     REACTION: unspecified  . Fluoxetine Hcl     REACTION: "too strong"  . Glipizide  Hypoglycemic reactions    Past Medical History  Diagnosis Date  . Allergy   . Anxiety   . Depression   . Diabetes mellitus   . GERD (gastroesophageal reflux disease)   . Hypertension   . Hyperlipidemia   . Thyroid disease   . Stroke     Past Surgical History  Procedure Laterality Date  . Cholecystectomy    . Tonsillectomy    . Vaginal delivery      x5  . Tubal ligation    . Endoscopic vein laser treatment  2006  . Meniscus repair  11/08    right knee  . Total knee arthroplasty  05/09    after CVA  . Cataract extraction      left eye    Family History  Problem Relation Age of Onset  . Coronary artery disease Mother   . Breast cancer Mother   . Cancer Mother     breast cancer, ovarian cancer  . Heart failure Mother   . Breast cancer Sister   . Cancer Sister     breast cancer  . Breast cancer Maternal  Aunt   . Cancer Maternal Aunt     breast cancer  . Breast cancer Maternal Grandmother   . Cancer Maternal Grandmother     breast cancer    History   Social History  . Marital Status: Single    Spouse Name: N/A    Number of Children: 5  . Years of Education: N/A   Occupational History  . DISABLED    Social History Main Topics  . Smoking status: Former Smoker    Types: Cigarettes    Quit date: 10/21/2005  . Smokeless tobacco: Never Used  . Alcohol Use: No  . Drug Use: No  . Sexual Activity: Not on file   Other Topics Concern  . Not on file   Social History Narrative   Has living will   Sister Sheila Cunningham or daughter (at [place]) is her health care POA.    Would accept brief resuscitation attempts but no prolonged artificial ventilation.   No tube feeds if cognitively unaware   Review of Systems Weight fairly stable Sleep is still not great--intervals of 3 hours, then up to void, then tosses and turns. No sig daytime somnolence Appetite is fine    Objective:   Physical Exam  Constitutional: She is oriented to person, place, and time. She appears well-developed and well-nourished. No distress.  HENT:  Mouth/Throat: Oropharynx is clear and moist. No oropharyngeal exudate.  Neck: Normal range of motion. Neck supple. No thyromegaly present.  Cardiovascular: Normal rate, regular rhythm, normal heart sounds and intact distal pulses.  Exam reveals no gallop.   No murmur heard. Pulmonary/Chest: Effort normal and breath sounds normal. No respiratory distress. She has no wheezes. She has no rales.  Abdominal: Soft. There is no tenderness.  Musculoskeletal: She exhibits no edema and no tenderness.  Lymphadenopathy:    She has no cervical adenopathy.    She has no axillary adenopathy.  Neurological: She is alert and oriented to person, place, and time.  President-- "Elyn Peers, [place], ?" [phone] [id number] Recall 2/3  Normal sensation in feet  Skin: No rash  noted. No erythema.  No foot lesions  Psychiatric: She has a normal mood and affect. Her behavior is normal.          Assessment & Plan:

## 2014-04-12 NOTE — Assessment & Plan Note (Signed)
Due for labs

## 2014-04-12 NOTE — Assessment & Plan Note (Signed)
I have personally reviewed the Medicare Annual Wellness questionnaire and have noted 1. The patient's medical and social history 2. Their use of alcohol, tobacco or illicit drugs 3. Their current medications and supplements 4. The patient's functional ability including ADL's, fall risks, home safety risks and hearing or visual             impairment. 5. Diet and physical activities 6. Evidence for depression or mood disorders  The patients weight, height, BMI and visual acuity have been recorded in the chart I have made referrals, counseling and provided education to the patient based review of the above and I have provided the pt with a written personalized care plan for preventive services.  I have provided you with a copy of your personalized plan for preventive services. Please take the time to review along with your updated medication list.  prevnar today Yearly flu vaccines Wants to continue yearly mammograms till age 78 Will set up DEXA No pap due to age UTD with colon

## 2014-04-12 NOTE — Progress Notes (Signed)
Pre visit review using our clinic review tool, if applicable. No additional management support is needed unless otherwise documented below in the visit note. 

## 2014-04-12 NOTE — Assessment & Plan Note (Signed)
Mostly anxiety Will change to alprazolam at her request

## 2014-04-12 NOTE — Assessment & Plan Note (Signed)
BP Readings from Last 3 Encounters:  04/12/14 110/70  03/23/14 124/78  01/10/14 120/80   Good control No ARB due to ACEI allergy No recent orthostatic symptoms

## 2014-04-12 NOTE — Assessment & Plan Note (Signed)
Hopefully still good control off the glipizide (which caused hypoglycemia)

## 2014-04-12 NOTE — Addendum Note (Signed)
Addended by: Despina Hidden on: 04/12/2014 03:52 PM   Modules accepted: Orders

## 2014-04-12 NOTE — Assessment & Plan Note (Signed)
Mostly recovered Sister still does her bills but cognition okay

## 2014-04-13 ENCOUNTER — Encounter: Payer: Self-pay | Admitting: *Deleted

## 2014-04-13 ENCOUNTER — Telehealth: Payer: Self-pay | Admitting: Internal Medicine

## 2014-04-13 NOTE — Telephone Encounter (Signed)
Relevant patient education mailed to patient.  

## 2014-05-11 ENCOUNTER — Ambulatory Visit: Payer: Self-pay | Admitting: Internal Medicine

## 2014-05-11 DIAGNOSIS — M899 Disorder of bone, unspecified: Secondary | ICD-10-CM | POA: Diagnosis not present

## 2014-05-11 DIAGNOSIS — N959 Unspecified menopausal and perimenopausal disorder: Secondary | ICD-10-CM | POA: Diagnosis not present

## 2014-05-11 DIAGNOSIS — Z1231 Encounter for screening mammogram for malignant neoplasm of breast: Secondary | ICD-10-CM | POA: Diagnosis not present

## 2014-05-11 DIAGNOSIS — M949 Disorder of cartilage, unspecified: Secondary | ICD-10-CM | POA: Diagnosis not present

## 2014-05-12 ENCOUNTER — Encounter: Payer: Self-pay | Admitting: Internal Medicine

## 2014-05-13 ENCOUNTER — Encounter: Payer: Self-pay | Admitting: Internal Medicine

## 2014-05-16 ENCOUNTER — Encounter: Payer: Self-pay | Admitting: *Deleted

## 2014-05-20 ENCOUNTER — Other Ambulatory Visit: Payer: Self-pay | Admitting: Internal Medicine

## 2014-05-20 NOTE — Telephone Encounter (Signed)
Rx called in as directed.   

## 2014-05-20 NOTE — Telephone Encounter (Signed)
Okay #60 x 0 

## 2014-05-20 NOTE — Telephone Encounter (Signed)
Ok to refill? Last filled 04/12/14 #60 with 0RF

## 2014-05-23 ENCOUNTER — Other Ambulatory Visit: Payer: Self-pay | Admitting: *Deleted

## 2014-05-23 MED ORDER — GLUCOSE BLOOD VI STRP: ORAL_STRIP | Status: DC

## 2014-05-24 ENCOUNTER — Encounter: Payer: Self-pay | Admitting: Internal Medicine

## 2014-05-24 ENCOUNTER — Ambulatory Visit (INDEPENDENT_AMBULATORY_CARE_PROVIDER_SITE_OTHER): Payer: Medicare Other | Admitting: Internal Medicine

## 2014-05-24 VITALS — BP 138/82 | HR 86 | Ht 64.0 in | Wt 217.0 lb

## 2014-05-24 DIAGNOSIS — K6289 Other specified diseases of anus and rectum: Secondary | ICD-10-CM

## 2014-05-24 DIAGNOSIS — K625 Hemorrhage of anus and rectum: Secondary | ICD-10-CM | POA: Diagnosis not present

## 2014-05-24 DIAGNOSIS — K602 Anal fissure, unspecified: Secondary | ICD-10-CM

## 2014-05-24 MED ORDER — AMBULATORY NON FORMULARY MEDICATION: Status: DC

## 2014-05-24 NOTE — Patient Instructions (Signed)
We have sent the following medications for you to pick up at your convenience:  Nitroglycerin Gel.  This has been faxed to [facility] at [facility].  You have been given some information on sitz baths
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
## 2014-05-25 ENCOUNTER — Telehealth: Payer: Self-pay

## 2014-05-25 NOTE — Telephone Encounter (Signed)
Pt left v/m; 04/12/14 lab results noted cholesterol was elevated and needed to discuss with pt if should start cholesterol med. Pt wants to know if can discuss over phone or does pt need to schedule appt. Pt request cb.

## 2014-05-25 NOTE — Telephone Encounter (Signed)
Find out why she stopped the simvastatin. I think she should be on a cholesterol medicine---she doesn't need a visit. I just don't want to restart the med if she had a problem with it

## 2014-05-27 NOTE — Telephone Encounter (Signed)
.  left message to have patient return my call.  

## 2014-05-27 NOTE — Telephone Encounter (Signed)
Pt stopped the simvastatin because it made her legs cramp so bad, would like something else. Also pt stated she had a small tear in her rectum and was put on nitroglycerin.

## 2014-05-27 NOTE — Telephone Encounter (Signed)
Put the simvastatin on allergy list as intolerance  Send Rx for atorvastatin 10mg  daily (1 year) Set up lipid, hepatic in about 6 weeks

## 2014-05-30 MED ORDER — ATORVASTATIN CALCIUM 10 MG PO TABS: 10.0000 mg | ORAL_TABLET | Freq: Every day | ORAL | Status: DC

## 2014-05-30 NOTE — Telephone Encounter (Signed)
Simvastatin added to allergy list, pt informed of new rx, rx sent electronically to pharmacy, and pt's lab appt scheduled.

## 2014-05-31 ENCOUNTER — Ambulatory Visit: Payer: Medicare Other | Admitting: Internal Medicine

## 2014-06-20 ENCOUNTER — Other Ambulatory Visit: Payer: Self-pay | Admitting: Internal Medicine

## 2014-06-20 NOTE — Telephone Encounter (Signed)
05/20/14 

## 2014-06-20 NOTE — Telephone Encounter (Signed)
Okay #60 x 0 

## 2014-06-21 NOTE — Telephone Encounter (Signed)
rx called into pharmacy

## 2014-06-23 ENCOUNTER — Other Ambulatory Visit: Payer: Self-pay | Admitting: Internal Medicine

## 2014-07-14 ENCOUNTER — Other Ambulatory Visit: Payer: Medicare Other

## 2014-07-15 DIAGNOSIS — Z23 Encounter for immunization: Secondary | ICD-10-CM | POA: Diagnosis not present

## 2014-07-18 ENCOUNTER — Other Ambulatory Visit: Payer: Self-pay | Admitting: Internal Medicine

## 2014-07-20 NOTE — Telephone Encounter (Signed)
Okay #60 x 0 

## 2014-07-20 NOTE — Telephone Encounter (Signed)
#  60 x0 last filled on 06/21/14, pt is scheduled for an appt with you on 10/03/14.

## 2014-07-20 NOTE — Telephone Encounter (Signed)
Called to [facility]. 

## 2014-07-26 ENCOUNTER — Other Ambulatory Visit: Payer: Medicare Other

## 2014-08-02 ENCOUNTER — Other Ambulatory Visit (INDEPENDENT_AMBULATORY_CARE_PROVIDER_SITE_OTHER): Payer: Medicare Other

## 2014-08-02 ENCOUNTER — Encounter: Payer: Self-pay | Admitting: Radiology

## 2014-08-02 DIAGNOSIS — E785 Hyperlipidemia, unspecified: Secondary | ICD-10-CM

## 2014-08-02 DIAGNOSIS — Z79899 Other long term (current) drug therapy: Secondary | ICD-10-CM | POA: Diagnosis not present

## 2014-08-02 LAB — HEPATIC FUNCTION PANEL
ALBUMIN: 3.7 g/dL (ref 3.5–5.2)
ALT: 20 U/L (ref 0–35)
AST: 27 U/L (ref 0–37)
Alkaline Phosphatase: 73 U/L (ref 39–117)
BILIRUBIN DIRECT: 0 mg/dL (ref 0.0–0.3)
Total Bilirubin: 0.7 mg/dL (ref 0.2–1.2)
Total Protein: 8.1 g/dL (ref 6.0–8.3)

## 2014-08-02 LAB — LIPID PANEL
Cholesterol: 231 mg/dL — ABNORMAL HIGH (ref 0–200)
HDL: 48.8 mg/dL (ref >39.00)
LDL Cholesterol: 152 mg/dL — ABNORMAL HIGH (ref 0–99)
NONHDL: 182.2
Total CHOL/HDL Ratio: 5
Triglycerides: 152 mg/dL — ABNORMAL HIGH (ref 0.0–149.0)
VLDL: 30.4 mg/dL (ref 0.0–40.0)

## 2014-08-04 ENCOUNTER — Encounter: Payer: Self-pay | Admitting: Family Medicine

## 2014-08-04 ENCOUNTER — Ambulatory Visit (INDEPENDENT_AMBULATORY_CARE_PROVIDER_SITE_OTHER): Payer: Medicare Other | Admitting: Family Medicine

## 2014-08-04 VITALS — BP 130/70 | HR 103 | Temp 97.7°F | Wt 209.8 lb

## 2014-08-04 DIAGNOSIS — N760 Acute vaginitis: Secondary | ICD-10-CM | POA: Insufficient documentation

## 2014-08-04 DIAGNOSIS — R300 Dysuria: Secondary | ICD-10-CM | POA: Diagnosis not present

## 2014-08-04 DIAGNOSIS — N898 Other specified noninflammatory disorders of vagina: Secondary | ICD-10-CM | POA: Diagnosis not present

## 2014-08-04 DIAGNOSIS — N9489 Other specified conditions associated with female genital organs and menstrual cycle: Secondary | ICD-10-CM

## 2014-08-04 DIAGNOSIS — R102 Pelvic and perineal pain unspecified side: Secondary | ICD-10-CM | POA: Insufficient documentation

## 2014-08-04 LAB — POCT URINALYSIS DIPSTICK
Bilirubin, UA: NEGATIVE
Blood, UA: NEGATIVE
Glucose, UA: NEGATIVE
KETONES UA: NEGATIVE
LEUKOCYTES UA: NEGATIVE
Nitrite, UA: NEGATIVE
PROTEIN UA: NEGATIVE
Spec Grav, UA: 1.02
Urobilinogen, UA: 0.2
pH, UA: 6

## 2014-08-04 MED ORDER — TERCONAZOLE 0.4 % VA CREA: 1.0000 | TOPICAL_CREAM | Freq: Every day | VAGINAL | Status: DC

## 2014-08-04 NOTE — Patient Instructions (Signed)
It was nice to meet you. Please use terazol suppositories nightly for 7 days. After you have completed this, call me with an update.

## 2014-08-04 NOTE — Progress Notes (Signed)
Pre visit review using our clinic review tool, if applicable. No additional management support is needed unless otherwise documented below in the visit note. 
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
## 2014-08-04 NOTE — Assessment & Plan Note (Signed)
New- consistent with vaginal yeast infection. Also likely worsened by irritation of skin with urine soaked pads- advised changing regularly. Treat with terazol suppositories nightly x 7 days. Call or return to clinic prn if these symptoms worsen or fail to improve as anticipated. The patient indicates understanding of these issues and agrees with the plan.

## 2014-08-10 ENCOUNTER — Telehealth: Payer: Self-pay

## 2014-08-10 MED ORDER — FLUCONAZOLE 150 MG PO TABS: 150.0000 mg | ORAL_TABLET | Freq: Once | ORAL | Status: AC

## 2014-08-10 NOTE — Telephone Encounter (Signed)
Pt was seen on 08/04/14; tonight is last night for terazol cream; pt still has perineal itching; burning sensation has stopped; no vaginal discharge. Pt is requesting oral medication discussed at 08/04/14 visit to completely get symptoms resolved. [facility]. Pt request cb.

## 2014-08-10 NOTE — Telephone Encounter (Signed)
Thank you for the update. eRx sent for diflucan.  Please keep [place] updated.

## 2014-08-10 NOTE — Telephone Encounter (Signed)
Patient notified as instructed by telephone. 

## 2014-08-15 ENCOUNTER — Encounter: Payer: Self-pay | Admitting: Internal Medicine

## 2014-08-15 ENCOUNTER — Ambulatory Visit: Payer: Medicare Other | Admitting: Internal Medicine

## 2014-08-18 ENCOUNTER — Telehealth: Payer: Self-pay

## 2014-08-18 NOTE — Telephone Encounter (Signed)
Pt has been taking Atorvastatin 10 mg 1/2 tab since 08/03/14 per result note; pt said leg cramping has continued and on 08/16/14 and 08/17/14 pt had N,V and diarrhea. Pt said N,V & diarrhea stopped last night. Pt did not take atorvastatin last night and is not going to take atorvastatin until sees Dr Silvio Pate on 08/22/14.Pt prefers not to see other providers. Pt will cb if condition changes or worsens. Pt does not request cb pt thinks she needs to see Dr Silvio Pate on 08/22/14.

## 2014-08-20 NOTE — Telephone Encounter (Signed)
I think we will have to keep her off the atorvastatin We will discuss this at her appt

## 2014-08-22 ENCOUNTER — Ambulatory Visit (INDEPENDENT_AMBULATORY_CARE_PROVIDER_SITE_OTHER): Payer: Medicare Other | Admitting: Internal Medicine

## 2014-08-22 ENCOUNTER — Encounter: Payer: Self-pay | Admitting: Internal Medicine

## 2014-08-22 VITALS — BP 120/80 | HR 96 | Temp 97.5°F | Wt 207.0 lb

## 2014-08-22 DIAGNOSIS — N760 Acute vaginitis: Secondary | ICD-10-CM

## 2014-08-22 DIAGNOSIS — E785 Hyperlipidemia, unspecified: Secondary | ICD-10-CM | POA: Diagnosis not present

## 2014-08-22 MED ORDER — CYCLOBENZAPRINE HCL 5 MG PO TABS: 5.0000 mg | ORAL_TABLET | Freq: Every day | ORAL | Status: DC | PRN

## 2014-08-22 MED ORDER — LOVASTATIN 40 MG PO TABS: 40.0000 mg | ORAL_TABLET | Freq: Every day | ORAL | Status: DC

## 2014-08-22 NOTE — Assessment & Plan Note (Signed)
Better but not resolved Advised trying probiotic
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
## 2014-08-22 NOTE — Assessment & Plan Note (Signed)
Sugars have been better since more careful with her eating Will recheck labs next visit

## 2014-08-22 NOTE — Assessment & Plan Note (Signed)
Has failed simvastatin and atorvastatin due to side effects Will try lovastatin as a last try If she fails this, will just keep off any statin

## 2014-08-22 NOTE — Patient Instructions (Signed)
Please try the lovastatin. If you get any muscle aches with this, just stop it and let me know when you come in next month. Please start a probiotic--this may help the chronic vaginal problems

## 2014-08-31 ENCOUNTER — Telehealth: Payer: Self-pay | Admitting: *Deleted

## 2014-08-31 NOTE — Telephone Encounter (Signed)
Faxed request asking for prior auth of flexeril.  Medicaid rejected it and medicare is asking for prior auth, ok to proceed with prior auth.

## 2014-09-01 NOTE — Telephone Encounter (Signed)
You can try--but it may not be approved due to her age. Should be inexpensive is she just wants to pay cash to see if it helps

## 2014-09-02 NOTE — Telephone Encounter (Signed)
Spoke with patient and she paid the cash price

## 2014-09-08 ENCOUNTER — Other Ambulatory Visit: Payer: Self-pay | Admitting: Internal Medicine

## 2014-09-09 NOTE — Telephone Encounter (Signed)
07/20/14 

## 2014-09-09 NOTE — Telephone Encounter (Signed)
Approved: #[id number] x 0 

## 2014-09-09 NOTE — Telephone Encounter (Signed)
Rx called in to pharmacy. 

## 2014-10-03 ENCOUNTER — Encounter: Payer: Self-pay | Admitting: Internal Medicine

## 2014-10-03 ENCOUNTER — Ambulatory Visit (INDEPENDENT_AMBULATORY_CARE_PROVIDER_SITE_OTHER): Payer: Medicare Other | Admitting: Internal Medicine

## 2014-10-03 VITALS — BP 148/80 | HR 86 | Temp 98.3°F | Wt 205.0 lb

## 2014-10-03 DIAGNOSIS — E785 Hyperlipidemia, unspecified: Secondary | ICD-10-CM

## 2014-10-03 DIAGNOSIS — E119 Type 2 diabetes mellitus without complications: Secondary | ICD-10-CM

## 2014-10-03 DIAGNOSIS — N898 Other specified noninflammatory disorders of vagina: Secondary | ICD-10-CM | POA: Diagnosis not present

## 2014-10-03 DIAGNOSIS — J301 Allergic rhinitis due to pollen: Secondary | ICD-10-CM | POA: Diagnosis not present

## 2014-10-03 LAB — COMPREHENSIVE METABOLIC PANEL
ALK PHOS: 69 U/L (ref 39–117)
ALT: 16 U/L (ref 0–35)
AST: 27 U/L (ref 0–37)
Albumin: 4.3 g/dL (ref 3.5–5.2)
BILIRUBIN TOTAL: 0.3 mg/dL (ref 0.2–1.2)
BUN: 18 mg/dL (ref 6–23)
CO2: 28 mEq/L (ref 19–32)
Calcium: 9.9 mg/dL (ref 8.4–10.5)
Chloride: 100 mEq/L (ref 96–112)
Creatinine, Ser: 0.8 mg/dL (ref 0.4–1.2)
GFR: 73.05 mL/min (ref >60.00)
Glucose, Bld: 103 mg/dL — ABNORMAL HIGH (ref 70–99)
Potassium: 3.9 mEq/L (ref 3.5–5.1)
SODIUM: 137 meq/L (ref 135–145)
TOTAL PROTEIN: 7.5 g/dL (ref 6.0–8.3)

## 2014-10-03 LAB — T4, FREE: Free T4: 0.91 ng/dL (ref 0.60–1.60)

## 2014-10-03 LAB — CBC WITH DIFFERENTIAL/PLATELET
BASOS ABS: 0.1 10*3/uL (ref 0.0–0.1)
Basophils Relative: 0.7 % (ref 0.0–3.0)
EOS ABS: 0.2 10*3/uL (ref 0.0–0.7)
Eosinophils Relative: 1.7 % (ref 0.0–5.0)
HEMATOCRIT: 43 % (ref 36.0–46.0)
Hemoglobin: 13.8 g/dL (ref 12.0–15.0)
LYMPHS ABS: 3.8 10*3/uL (ref 0.7–4.0)
Lymphocytes Relative: 32.5 % (ref 12.0–46.0)
MCHC: 32.1 g/dL (ref 30.0–36.0)
MCV: 88.8 fl (ref 78.0–100.0)
Monocytes Absolute: 0.9 10*3/uL (ref 0.1–1.0)
Monocytes Relative: 7.9 % (ref 3.0–12.0)
Neutro Abs: 6.7 10*3/uL (ref 1.4–7.7)
Neutrophils Relative %: 57.2 % (ref 43.0–77.0)
PLATELETS: 355 10*3/uL (ref 150.0–400.0)
RBC: 4.84 Mil/uL (ref 3.87–5.11)
RDW: 14.4 % (ref 11.5–15.5)
WBC: 11.7 10*3/uL — ABNORMAL HIGH (ref 4.0–10.5)

## 2014-10-03 LAB — HEMOGLOBIN A1C: HEMOGLOBIN A1C: 7.4 % — AB (ref 4.6–6.5)

## 2014-10-03 LAB — LIPID PANEL
CHOLESTEROL: 150 mg/dL (ref 0–200)
HDL: 56 mg/dL (ref >39.00)
LDL Cholesterol: 66 mg/dL (ref 0–99)
NonHDL: 94
Total CHOL/HDL Ratio: 3
Triglycerides: 142 mg/dL (ref 0.0–149.0)
VLDL: 28.4 mg/dL (ref 0.0–40.0)

## 2014-10-03 NOTE — Progress Notes (Signed)
Pre visit review using our clinic review tool, if applicable. No additional management support is needed unless otherwise documented below in the visit note. 

## 2014-10-03 NOTE — Assessment & Plan Note (Signed)
Her current symptoms seem to be allergy related Doesn't seem sick

## 2014-10-03 NOTE — Assessment & Plan Note (Signed)
Eating better  Has lost 10#  Goal under 8%---hopefully has improved (no new meds even if up in 8's)
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
## 2014-10-03 NOTE — Assessment & Plan Note (Signed)
Doing better now on the probiotic

## 2014-10-03 NOTE — Assessment & Plan Note (Signed)
Mild myalgias with lovastatin but tolerable Will check labs on this

## 2014-10-04 ENCOUNTER — Encounter: Payer: Self-pay | Admitting: *Deleted

## 2014-10-17 ENCOUNTER — Other Ambulatory Visit: Payer: Self-pay | Admitting: Internal Medicine

## 2014-10-17 NOTE — Telephone Encounter (Signed)
09/09/14 

## 2014-10-18 NOTE — Telephone Encounter (Signed)
Approved: #[id number] x 0 

## 2014-10-18 NOTE — Telephone Encounter (Signed)
rx called into pharmacy

## 2014-11-16 ENCOUNTER — Other Ambulatory Visit: Payer: Self-pay | Admitting: Internal Medicine

## 2014-11-16 NOTE — Telephone Encounter (Signed)
Last seen 10/03/14. Next appt on 04/10/15.

## 2014-11-17 NOTE — Telephone Encounter (Signed)
Approved: #[id number] x 0 

## 2014-12-15 ENCOUNTER — Other Ambulatory Visit: Payer: Self-pay | Admitting: Internal Medicine

## 2014-12-15 NOTE — Telephone Encounter (Signed)
Xanax called into [facility] per Dr Silvio Pate approval.

## 2014-12-15 NOTE — Telephone Encounter (Signed)
Electronic refill request for xanax.  Last seen 10/03/2014.  Last filled 11/17/2014.  Please advise.

## 2014-12-15 NOTE — Telephone Encounter (Signed)
Approved: #[id number] x 0 

## 2014-12-28 ENCOUNTER — Other Ambulatory Visit: Payer: Self-pay | Admitting: Internal Medicine

## 2015-01-10 ENCOUNTER — Other Ambulatory Visit: Payer: Self-pay | Admitting: Internal Medicine

## 2015-01-10 NOTE — Telephone Encounter (Signed)
12/15/2014 

## 2015-01-10 NOTE — Telephone Encounter (Signed)
Approved: #[id number] x 0 

## 2015-01-10 NOTE — Telephone Encounter (Signed)
[facility]

## 2015-01-24 DIAGNOSIS — L400 Psoriasis vulgaris: Secondary | ICD-10-CM | POA: Diagnosis not present

## 2015-01-24 DIAGNOSIS — L820 Inflamed seborrheic keratosis: Secondary | ICD-10-CM | POA: Diagnosis not present

## 2015-02-03 ENCOUNTER — Encounter: Payer: Self-pay | Admitting: Family Medicine

## 2015-02-03 ENCOUNTER — Ambulatory Visit (INDEPENDENT_AMBULATORY_CARE_PROVIDER_SITE_OTHER): Payer: Medicare Other | Admitting: Family Medicine

## 2015-02-03 VITALS — BP 148/84 | HR 97 | Temp 97.4°F | Ht 64.0 in | Wt 205.0 lb

## 2015-02-03 DIAGNOSIS — H8113 Benign paroxysmal vertigo, bilateral: Secondary | ICD-10-CM | POA: Diagnosis not present

## 2015-02-03 DIAGNOSIS — J0100 Acute maxillary sinusitis, unspecified: Secondary | ICD-10-CM

## 2015-02-03 DIAGNOSIS — J301 Allergic rhinitis due to pollen: Secondary | ICD-10-CM

## 2015-02-03 DIAGNOSIS — J019 Acute sinusitis, unspecified: Secondary | ICD-10-CM | POA: Insufficient documentation

## 2015-02-03 DIAGNOSIS — H8110 Benign paroxysmal vertigo, unspecified ear: Secondary | ICD-10-CM | POA: Insufficient documentation

## 2015-02-03 MED ORDER — AMOXICILLIN 500 MG PO CAPS: 500.0000 mg | ORAL_CAPSULE | Freq: Three times a day (TID) | ORAL | Status: DC

## 2015-02-03 MED ORDER — AZELASTINE HCL 0.1 % NA SOLN: 2.0000 | Freq: Two times a day (BID) | NASAL | Status: AC

## 2015-02-03 MED ORDER — MECLIZINE HCL 25 MG PO TABS: 25.0000 mg | ORAL_TABLET | Freq: Three times a day (TID) | ORAL | Status: AC | PRN
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
## 2015-02-03 NOTE — Patient Instructions (Signed)
Take the amoxicillin for sinus infection as directed  Get back on astelin for allergies  Drink fluids and rest  Try meclizine for dizziness as needed - watch out for sedation   If symptoms suddenly worsen (headache or dizziness-please let me know )- and seek care at ER if after hours

## 2015-02-03 NOTE — Progress Notes (Signed)
Pre visit review using our clinic review tool, if applicable. No additional management support is needed unless otherwise documented below in the visit note. 

## 2015-02-05 NOTE — Assessment & Plan Note (Signed)
This is intermittent and likely rel to ETD/ recent nasal allergies and sinusitis Will tx those Given meclizine to use prn with warning of sedation  Disc imp of changing pos slowly  Will update and seek care if new neurol sympt/worse or not imp in the next wk

## 2015-02-05 NOTE — Assessment & Plan Note (Signed)
From likely combination of allergic rhinitis and URI  tx with amoxicillin Disc symptomatic care - see instructions on AVS  tx allergy symptoms  Update if not starting to improve in a week or if worsening

## 2015-02-05 NOTE — Assessment & Plan Note (Signed)
Refilled astelin Disc allergen avoidance  Continue claritin

## 2015-02-06 ENCOUNTER — Telehealth: Payer: Self-pay

## 2015-02-06 MED ORDER — CLORAZEPATE DIPOTASSIUM 7.5 MG PO TABS: ORAL_TABLET | ORAL | Status: DC

## 2015-02-06 NOTE — Telephone Encounter (Signed)
Spoke with patient and her son has cancer and will be at [facility] next week for procedures and she either would like the medication changed or increased. Per pt her nerves are bad and can't stand loosing another child.

## 2015-02-06 NOTE — Telephone Encounter (Signed)
Will change to tranxene again - she can check back with PCP when he returns re: long term plan Px written for call in

## 2015-02-06 NOTE — Telephone Encounter (Signed)
I would agree with just continuing her back on the tranxene

## 2015-02-06 NOTE — Telephone Encounter (Signed)
Pt left note that alprazolam is not helping and pt request to be switched to tranxene.[facility].Please advise.

## 2015-02-06 NOTE — Telephone Encounter (Signed)
rx called into pharmacy Spoke with patient and advised results   

## 2015-02-08 ENCOUNTER — Ambulatory Visit: Payer: Medicare Other | Admitting: Family Medicine

## 2015-02-09 ENCOUNTER — Telehealth: Payer: Self-pay | Admitting: *Deleted

## 2015-02-09 NOTE — Telephone Encounter (Signed)
PA form received for pt's tranxene, form placed in your inbox

## 2015-02-10 NOTE — Telephone Encounter (Signed)
This is Dr Alla German pt - I refilled this med while he was out - I put it out with a sticky note to go to him

## 2015-02-12 NOTE — Op Note (Signed)
PATIENT NAME:  BAY, JARQUIN MR#:  [id number] DATE OF BIRTH:  24-Jan-1936  DATE OF PROCEDURE:  04/06/2012  PREOPERATIVE DIAGNOSIS:  Cataract, right eye.   POSTOPERATIVE DIAGNOSIS:  Cataract, right eye.  PROCEDURE PERFORMED:  Extracapsular cataract extraction using phacoemulsification with placement of an Alcon [id number], 19.0-diopter posterior chamber lens, serial # [id number].  SURGEON:  Loura Back. Danay Mckellar, MD  ASSISTANT:  None.  ANESTHESIA:  4% lidocaine and 0.75% Marcaine in a 50/50 mixture with 10 units/mL of Hylenex added, given as a peribulbar.  ANESTHESIOLOGIST:  Dr. Benjamine Mola.  COMPLICATIONS:  None.  ESTIMATED BLOOD LOSS:  Less than 1 mL.  DESCRIPTION OF PROCEDURE:  The patient was brought to the operating room and given a peribulbar block.  The patient was then prepped and draped in the usual fashion.  The vertical rectus muscles were imbricated using 5-0 silk sutures.  These sutures were then clamped to the sterile drapes as bridle sutures.  A limbal peritomy was performed extending two clock hours and hemostasis was obtained with cautery.  A partial thickness scleral groove was made at the surgical limbus and dissected anteriorly in a lamellar dissection using an Alcon crescent knife.  The anterior chamber was entered superonasally with a Superblade and through the lamellar dissection with a 2.6 mm keratome.  DisCoVisc was used to replace the aqueous and a continuous tear capsulorrhexis was carried out.  Hydrodissection and hydrodelineation were carried out with balanced salt and a 27 gauge canula.  The nucleus was rotated to confirm the effectiveness of the hydrodissection.  Phacoemulsification was carried out using a divide-and-conquer technique.  Total ultrasound time was 1 minute and 37 seconds with an average power of 23.9 percent. CDE 38.27.  Irrigation/aspiration was used to remove the residual cortex.  DisCoVisc was used to inflate the capsule and the internal incision was  enlarged to 3 mm with the crescent knife.  The intraocular lens was folded and inserted into the capsular bag using the AcrySert delivery system.  Irrigation/aspiration was used to remove the residual DisCoVisc.  Miostat was injected into the anterior chamber through the paracentesis track to inflate the anterior chamber and induce miosis.  The wound was checked for leaks and none were found. The conjunctiva was closed with cautery and the bridle sutures were removed.  Two drops of 0.3% Vigamox were placed on the eye.   An eye shield was placed on the eye.  The patient was discharged to the recovery room in good condition.  ____________________________ Loura Back Edynn Gillock, MD sad:cms D: 04/06/2012 13:00:36 ET T: 04/06/2012 13:06:55 ET JOB#: [id number]  cc: Remo Lipps A. Ariyannah Pauling, MD, <Dictator> Martie Lee MD ELECTRONICALLY SIGNED 04/10/2012 12:07

## 2015-03-08 ENCOUNTER — Other Ambulatory Visit: Payer: Self-pay | Admitting: Family Medicine

## 2015-03-09 NOTE — Telephone Encounter (Signed)
rx called into pharmacy

## 2015-03-09 NOTE — Telephone Encounter (Signed)
Approved: #[phone] x 0 

## 2015-03-15 ENCOUNTER — Other Ambulatory Visit: Payer: Self-pay | Admitting: Internal Medicine

## 2015-04-02 ENCOUNTER — Other Ambulatory Visit: Payer: Self-pay | Admitting: Internal Medicine

## 2015-04-06 ENCOUNTER — Other Ambulatory Visit: Payer: Self-pay | Admitting: Internal Medicine

## 2015-04-07 NOTE — Telephone Encounter (Signed)
Approved: #[phone] x 0 

## 2015-04-07 NOTE — Telephone Encounter (Signed)
rx called into pharmacy

## 2015-04-07 NOTE — Telephone Encounter (Signed)
03/09/2015 

## 2015-04-10 ENCOUNTER — Ambulatory Visit (INDEPENDENT_AMBULATORY_CARE_PROVIDER_SITE_OTHER): Payer: Medicare Other | Admitting: Internal Medicine

## 2015-04-10 ENCOUNTER — Encounter: Payer: Self-pay | Admitting: Internal Medicine

## 2015-04-10 VITALS — BP 132/72 | HR 91 | Temp 98.4°F | Ht 64.0 in | Wt 209.0 lb

## 2015-04-10 DIAGNOSIS — E119 Type 2 diabetes mellitus without complications: Secondary | ICD-10-CM | POA: Diagnosis not present

## 2015-04-10 DIAGNOSIS — E039 Hypothyroidism, unspecified: Secondary | ICD-10-CM

## 2015-04-10 DIAGNOSIS — F39 Unspecified mood [affective] disorder: Secondary | ICD-10-CM

## 2015-04-10 DIAGNOSIS — I6990 Unspecified sequelae of unspecified cerebrovascular disease: Secondary | ICD-10-CM | POA: Diagnosis not present

## 2015-04-10 DIAGNOSIS — Z7189 Other specified counseling: Secondary | ICD-10-CM

## 2015-04-10 DIAGNOSIS — I10 Essential (primary) hypertension: Secondary | ICD-10-CM | POA: Diagnosis not present

## 2015-04-10 DIAGNOSIS — E785 Hyperlipidemia, unspecified: Secondary | ICD-10-CM

## 2015-04-10 DIAGNOSIS — Z0000 Encounter for general adult medical examination without abnormal findings: Secondary | ICD-10-CM | POA: Diagnosis not present

## 2015-04-10 LAB — HM DIABETES FOOT EXAM

## 2015-04-10 MED ORDER — GLUCOSE BLOOD VI STRP: ORAL_STRIP | Status: DC

## 2015-04-10 NOTE — Assessment & Plan Note (Signed)
Seems to still have good control Will recheck Going for eye exam soon

## 2015-04-10 NOTE — Assessment & Plan Note (Signed)
Some aches but staying on it

## 2015-04-10 NOTE — Assessment & Plan Note (Signed)
Counseled on exercise--walking around a store is it Yearly flu shot Not sure about mammograms--- would be due in 2017 if continuing No colonoscopy due to normal 2011

## 2015-04-10 NOTE — Assessment & Plan Note (Signed)
DNR done today 

## 2015-04-10 NOTE — Assessment & Plan Note (Signed)
Seems to be euthyroid Lab Results  Component Value Date   TSH 0.50 04/12/2014

## 2015-04-10 NOTE — Assessment & Plan Note (Signed)
Lots of stress with son's cancer Uses the clonazepam regularly
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
## 2015-04-10 NOTE — Assessment & Plan Note (Signed)
BP Readings from Last 3 Encounters:  04/10/15 132/72  02/03/15 148/84  10/03/14 148/80   Reasonable control No ARB due to ACEI allergy

## 2015-04-10 NOTE — Assessment & Plan Note (Signed)
Very mild cognitive changes On ASA and statin

## 2015-04-11 ENCOUNTER — Encounter: Payer: Self-pay | Admitting: *Deleted

## 2015-04-11 LAB — MICROALBUMIN / CREATININE URINE RATIO
Creatinine,U: 93.2 mg/dL
Microalb Creat Ratio: 0.8 mg/g (ref 0.0–30.0)
Microalb, Ur: 0.7 mg/dL (ref 0.0–1.9)

## 2015-04-11 LAB — T4, FREE: Free T4: 0.63 ng/dL (ref 0.60–1.60)

## 2015-04-11 LAB — TSH: TSH: 2.36 u[IU]/mL (ref 0.35–4.50)

## 2015-04-11 LAB — HEMOGLOBIN A1C: Hgb A1c MFr Bld: 7 % — ABNORMAL HIGH (ref 4.6–6.5)

## 2015-05-08 ENCOUNTER — Other Ambulatory Visit: Payer: Self-pay | Admitting: Internal Medicine

## 2015-05-08 NOTE — Telephone Encounter (Signed)
Electronic refill request. Last Filled:    90 tablet 0 04/07/2015  Please advise.

## 2015-05-09 NOTE — Telephone Encounter (Signed)
Approved: okay #[id number] x [phone] 

## 2015-05-09 NOTE — Telephone Encounter (Signed)
rx called into pharmacy

## 2015-05-10 ENCOUNTER — Other Ambulatory Visit: Payer: Self-pay | Admitting: Internal Medicine

## 2015-05-10 DIAGNOSIS — Z1231 Encounter for screening mammogram for malignant neoplasm of breast: Secondary | ICD-10-CM

## 2015-05-16 ENCOUNTER — Other Ambulatory Visit: Payer: Self-pay | Admitting: Internal Medicine

## 2015-05-16 ENCOUNTER — Ambulatory Visit
Admission: RE | Admit: 2015-05-16 | Discharge: 2015-05-16 | Disposition: A | Discharge status: Home / Self Care | Payer: Medicare Other | Source: Ambulatory Visit | Attending: Internal Medicine | Admitting: Internal Medicine

## 2015-05-16 DIAGNOSIS — Z1231 Encounter for screening mammogram for malignant neoplasm of breast: Secondary | ICD-10-CM | POA: Diagnosis not present

## 2015-05-16 LAB — HM MAMMOGRAPHY

## 2015-05-17 ENCOUNTER — Encounter: Payer: Self-pay | Admitting: *Deleted

## 2015-05-18 DIAGNOSIS — Z961 Presence of intraocular lens: Secondary | ICD-10-CM | POA: Diagnosis not present

## 2015-05-18 LAB — HM DIABETES EYE EXAM

## 2015-05-19 ENCOUNTER — Other Ambulatory Visit: Payer: Self-pay | Admitting: *Deleted

## 2015-05-19 MED ORDER — TRUE METRIX AIR GLUCOSE METER DEVI: 1.0000 | Freq: Every day | Status: AC

## 2015-05-23 DIAGNOSIS — L858 Other specified epidermal thickening: Secondary | ICD-10-CM | POA: Diagnosis not present

## 2015-05-23 DIAGNOSIS — L400 Psoriasis vulgaris: Secondary | ICD-10-CM | POA: Diagnosis not present

## 2015-05-23 DIAGNOSIS — L853 Xerosis cutis: Secondary | ICD-10-CM | POA: Diagnosis not present

## 2015-05-23 DIAGNOSIS — L820 Inflamed seborrheic keratosis: Secondary | ICD-10-CM | POA: Diagnosis not present

## 2015-05-24 ENCOUNTER — Encounter: Payer: Self-pay | Admitting: Internal Medicine

## 2015-06-01 ENCOUNTER — Other Ambulatory Visit: Payer: Self-pay | Admitting: Internal Medicine

## 2015-06-07 ENCOUNTER — Other Ambulatory Visit: Payer: Self-pay | Admitting: Internal Medicine

## 2015-06-08 NOTE — Telephone Encounter (Signed)
rx called into pharmacy

## 2015-06-08 NOTE — Telephone Encounter (Signed)
05/09/2015 

## 2015-06-08 NOTE — Telephone Encounter (Signed)
Approved: okay #[id number] x [phone] 

## 2015-06-14 ENCOUNTER — Other Ambulatory Visit: Payer: Self-pay | Admitting: *Deleted

## 2015-06-14 MED ORDER — LOVASTATIN 40 MG PO TABS: 40.0000 mg | ORAL_TABLET | Freq: Every day | ORAL | Status: DC

## 2015-07-06 ENCOUNTER — Encounter: Payer: Self-pay | Admitting: Internal Medicine

## 2015-07-10 ENCOUNTER — Other Ambulatory Visit: Payer: Self-pay | Admitting: Internal Medicine

## 2015-07-10 NOTE — Telephone Encounter (Signed)
Approved: okay #[id number] x [phone] 

## 2015-07-10 NOTE — Telephone Encounter (Signed)
06/08/2015 

## 2015-07-11 ENCOUNTER — Ambulatory Visit (INDEPENDENT_AMBULATORY_CARE_PROVIDER_SITE_OTHER): Payer: Medicare Other | Admitting: Internal Medicine

## 2015-07-11 ENCOUNTER — Encounter: Payer: Self-pay | Admitting: Internal Medicine

## 2015-07-11 VITALS — BP 130/80 | HR 92 | Temp 97.5°F | Wt 213.0 lb

## 2015-07-11 DIAGNOSIS — Z23 Encounter for immunization: Secondary | ICD-10-CM

## 2015-07-11 DIAGNOSIS — J0100 Acute maxillary sinusitis, unspecified: Secondary | ICD-10-CM | POA: Diagnosis not present

## 2015-07-11 MED ORDER — AMOXICILLIN 500 MG PO TABS: 1000.0000 mg | ORAL_TABLET | Freq: Two times a day (BID) | ORAL | Status: DC

## 2015-07-11 MED ORDER — GLUCOSE BLOOD VI STRP: ORAL_STRIP | Status: DC

## 2015-07-11 NOTE — Patient Instructions (Signed)
If the nasal congestion is not better in a few days, you may want to try over the counter flonase or nasacort
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
## 2015-07-11 NOTE — Telephone Encounter (Signed)
rx called into pharmacy

## 2015-07-11 NOTE — Addendum Note (Signed)
Addended by: Despina Hidden on: 07/11/2015 12:03 PM   Modules accepted: Orders

## 2015-07-11 NOTE — Assessment & Plan Note (Signed)
Ongoing symptoms Likely secondary bacterial infection Will treat with amoxil Discussed symptom care

## 2015-08-07 ENCOUNTER — Other Ambulatory Visit: Payer: Self-pay | Admitting: Internal Medicine

## 2015-08-07 NOTE — Telephone Encounter (Signed)
rx called into pharmacy

## 2015-08-07 NOTE — Telephone Encounter (Signed)
plz phone in. 

## 2015-08-07 NOTE — Telephone Encounter (Signed)
07/11/2015 Not due until Thursday [facility] PATIENT, Please send back to me for call in

## 2015-09-05 ENCOUNTER — Other Ambulatory Visit: Payer: Self-pay | Admitting: Family Medicine

## 2015-09-05 NOTE — Telephone Encounter (Signed)
08/07/2015 

## 2015-09-05 NOTE — Telephone Encounter (Signed)
Approved: #[phone] x 0 

## 2015-09-05 NOTE — Telephone Encounter (Signed)
rx called into pharmacy

## 2015-09-27 ENCOUNTER — Encounter: Payer: Self-pay | Admitting: Internal Medicine

## 2015-09-27 ENCOUNTER — Ambulatory Visit (INDEPENDENT_AMBULATORY_CARE_PROVIDER_SITE_OTHER): Payer: Medicare Other | Admitting: Internal Medicine

## 2015-09-27 VITALS — BP 120/80 | HR 89 | Temp 98.6°F | Wt 214.0 lb

## 2015-09-27 DIAGNOSIS — E785 Hyperlipidemia, unspecified: Secondary | ICD-10-CM | POA: Diagnosis not present

## 2015-09-27 DIAGNOSIS — E1140 Type 2 diabetes mellitus with diabetic neuropathy, unspecified: Secondary | ICD-10-CM | POA: Diagnosis not present

## 2015-09-27 DIAGNOSIS — F39 Unspecified mood [affective] disorder: Secondary | ICD-10-CM

## 2015-09-27 DIAGNOSIS — I10 Essential (primary) hypertension: Secondary | ICD-10-CM | POA: Diagnosis not present

## 2015-09-27 LAB — CBC WITH DIFFERENTIAL/PLATELET
BASOS ABS: 0.1 10*3/uL (ref 0.0–0.1)
BASOS PCT: 0.6 % (ref 0.0–3.0)
EOS PCT: 2.4 % (ref 0.0–5.0)
Eosinophils Absolute: 0.3 10*3/uL (ref 0.0–0.7)
HEMATOCRIT: 43.2 % (ref 36.0–46.0)
Hemoglobin: 13.9 g/dL (ref 12.0–15.0)
LYMPHS PCT: 28.4 % (ref 12.0–46.0)
Lymphs Abs: 3 10*3/uL (ref 0.7–4.0)
MCHC: 32.2 g/dL (ref 30.0–36.0)
MCV: 87.7 fl (ref 78.0–100.0)
MONOS PCT: 8.2 % (ref 3.0–12.0)
Monocytes Absolute: 0.9 10*3/uL (ref 0.1–1.0)
NEUTROS ABS: 6.5 10*3/uL (ref 1.4–7.7)
Neutrophils Relative %: 60.4 % (ref 43.0–77.0)
PLATELETS: 348 10*3/uL (ref 150.0–400.0)
RBC: 4.92 Mil/uL (ref 3.87–5.11)
RDW: 15.2 % (ref 11.5–15.5)
WBC: 10.7 10*3/uL — ABNORMAL HIGH (ref 4.0–10.5)

## 2015-09-27 LAB — LIPID PANEL
CHOL/HDL RATIO: 3
Cholesterol: 161 mg/dL (ref 0–200)
HDL: 57.9 mg/dL (ref >39.00)
LDL Cholesterol: 75 mg/dL (ref 0–99)
NONHDL: 103.09
Triglycerides: 139 mg/dL (ref 0.0–149.0)
VLDL: 27.8 mg/dL (ref 0.0–40.0)

## 2015-09-27 LAB — COMPREHENSIVE METABOLIC PANEL
ALT: 15 U/L (ref 0–35)
AST: 28 U/L (ref 0–37)
Albumin: 4.3 g/dL (ref 3.5–5.2)
Alkaline Phosphatase: 79 U/L (ref 39–117)
BILIRUBIN TOTAL: 0.3 mg/dL (ref 0.2–1.2)
BUN: 20 mg/dL (ref 6–23)
CALCIUM: 10.1 mg/dL (ref 8.4–10.5)
CHLORIDE: 98 meq/L (ref 96–112)
CO2: 32 meq/L (ref 19–32)
Creatinine, Ser: 0.83 mg/dL (ref 0.40–1.20)
GFR: 70.84 mL/min (ref >60.00)
Glucose, Bld: 115 mg/dL — ABNORMAL HIGH (ref 70–99)
POTASSIUM: 4.4 meq/L (ref 3.5–5.1)
Sodium: 138 mEq/L (ref 135–145)
Total Protein: 7.4 g/dL (ref 6.0–8.3)

## 2015-09-27 LAB — HEMOGLOBIN A1C: Hgb A1c MFr Bld: 7.5 % — ABNORMAL HIGH (ref 4.6–6.5)

## 2015-09-27 NOTE — Assessment & Plan Note (Signed)
Hopefully still good control New neuropathic pain--- discussed strategies  Will start bedtime gabapentin if worsens

## 2015-09-27 NOTE — Assessment & Plan Note (Signed)
Continues on secondary prevention 

## 2015-09-27 NOTE — Progress Notes (Signed)
Pre visit review using our clinic review tool, if applicable. No additional management support is needed unless otherwise documented below in the visit note. 

## 2015-09-27 NOTE — Assessment & Plan Note (Signed)
Still stress with son's cancer Doing okay overall

## 2015-09-27 NOTE — Assessment & Plan Note (Signed)
BP Readings from Last 3 Encounters:  09/27/15 120/80  07/11/15 130/80  04/10/15 132/72   Good control No change needed
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
## 2015-09-28 ENCOUNTER — Encounter: Payer: Self-pay | Admitting: *Deleted

## 2015-09-28 ENCOUNTER — Telehealth: Payer: Self-pay

## 2015-09-28 MED ORDER — GABAPENTIN 300 MG PO CAPS: 300.0000 mg | ORAL_CAPSULE | Freq: Every day | ORAL | Status: DC

## 2015-09-28 NOTE — Telephone Encounter (Signed)
We spoke about this at our visit but she hadn't decided. I sent the Rx Let her know to try 300mg  nightly for a week and increase then if still having pain. She needs to let me know if she thinks the 300 is too much--- I could then try the lower 100mg  dose (though I don't think this will be enough)

## 2015-09-28 NOTE — Telephone Encounter (Signed)
Pt left v/m; pt seen 09/27/15, pt request gabapentin for feet to [facility]. Not on med list.Please advise.pt request cb when med sent to pharmacy.

## 2015-09-28 NOTE — Telephone Encounter (Signed)
Spoke with patient and advised results, she will call if any problems

## 2015-10-03 ENCOUNTER — Telehealth: Payer: Self-pay | Admitting: Internal Medicine

## 2015-10-03 NOTE — Telephone Encounter (Signed)
Spoke with patient and advised results   

## 2015-10-03 NOTE — Telephone Encounter (Signed)
It still sounds like the viral infection she was starting last week. If she is worsening, with more cough and nasty mucus, fever, etc---if may then be reasonable to try an antibiotic

## 2015-10-03 NOTE — Telephone Encounter (Signed)
Pt wanted to know since she was here last week could dr Silvio Pate call her something in.  She has lost her voice coughing a lot and a lot of mucas (clear) no fever  [facility]

## 2015-10-04 ENCOUNTER — Telehealth: Payer: Self-pay

## 2015-10-04 ENCOUNTER — Other Ambulatory Visit: Payer: Self-pay | Admitting: Internal Medicine

## 2015-10-04 NOTE — Telephone Encounter (Signed)
09/05/2015 

## 2015-10-04 NOTE — Telephone Encounter (Signed)
This is the cold that is going around If she is not getting better in the next few days, we can try an antibiotic

## 2015-10-04 NOTE — Telephone Encounter (Signed)
Spoke with patient and advised results, pt has clear nasal drainage.

## 2015-10-04 NOTE — Telephone Encounter (Signed)
rx called into pharmacy

## 2015-10-04 NOTE — Telephone Encounter (Signed)
Approved: #[phone] x 0 

## 2015-10-04 NOTE — Telephone Encounter (Signed)
Pt left v/m; pt was seen 09/27/15 for f/u appt but had S/T and post nasal drainage; now pt has raw throat, trouble swallowing and cheek bone is sore. Pt request med or what to do? Pt request cb.[facility].

## 2015-10-05 ENCOUNTER — Ambulatory Visit: Payer: Medicare Other | Admitting: Internal Medicine

## 2015-10-06 DIAGNOSIS — J019 Acute sinusitis, unspecified: Secondary | ICD-10-CM | POA: Diagnosis not present

## 2015-10-06 DIAGNOSIS — I10 Essential (primary) hypertension: Secondary | ICD-10-CM | POA: Diagnosis not present

## 2015-11-05 ENCOUNTER — Other Ambulatory Visit: Payer: Self-pay | Admitting: Internal Medicine

## 2015-11-06 NOTE — Telephone Encounter (Signed)
rx called into pharmacy

## 2015-11-06 NOTE — Telephone Encounter (Signed)
Approved: #[phone] x 0 

## 2015-11-06 NOTE — Telephone Encounter (Signed)
10/04/15 

## 2015-12-06 ENCOUNTER — Other Ambulatory Visit: Payer: Self-pay | Admitting: Internal Medicine

## 2015-12-06 NOTE — Telephone Encounter (Signed)
rx called into pharmacy

## 2015-12-06 NOTE — Telephone Encounter (Signed)
Approved: #[phone] x 0 

## 2015-12-06 NOTE — Telephone Encounter (Signed)
11/06/2015 

## 2016-01-02 ENCOUNTER — Other Ambulatory Visit: Payer: Self-pay | Admitting: Internal Medicine

## 2016-01-02 NOTE — Telephone Encounter (Signed)
Refill called in on voice mail at pharmacy

## 2016-01-02 NOTE — Telephone Encounter (Signed)
Last refill 12-06-15 #90/0 Last OV 09-27-15 Next OV 04-11-16 Forwarding to Dr Diona Browner in Dr Alla German absence. Please send back to me when approved. Thanks

## 2016-01-04 ENCOUNTER — Other Ambulatory Visit: Payer: Self-pay | Admitting: Internal Medicine

## 2016-01-05 ENCOUNTER — Telehealth: Payer: Self-pay | Admitting: Internal Medicine

## 2016-01-05 NOTE — Telephone Encounter (Signed)
[place] [facility]  Patient Name: Sheila Cunningham  DOB: [birth year]    Initial Comment Caller states, wanted to make an appt for next week, blood sugar has been elevated, and urine was dark for a couple of weeks, morning fasting sugar goes 140-170    Nurse Assessment  Nurse: Wynetta Emery, RN, Baker Janus Date/Time (Eastern Time): 01/05/2016 9:48:22 AM  Confirm and document reason for call. If symptomatic, describe symptoms. You must click the next button to save text entered. [name] is diabetes 2 on metfomin fasting reading last few days is 140 to 170's and dark yellow urine and concerned she needs to have blood sugars not eating any sugars  Has the patient traveled out of the country within the last 30 days? ---No  Does the patient have any new or worsening symptoms? ---Yes  Will a triage be completed? ---Yes  Related visit to physician within the last 2 weeks? ---No  Does the PT have any chronic conditions? (i.e. diabetes, asthma, etc.) ---Yes  List chronic conditions. ---Diabetes 2  Is this a behavioral health or substance abuse call? ---No     Guidelines    Guideline Title Affirmed Question Affirmed Notes  Diabetes - High Blood Sugar [1] Symptoms of high blood sugar (e.g., frequent urination, weak, weight loss) AND [2] not able to test blood glucose    Final Disposition User   See Physician within 24 Hours [place], RN, Baker Janus    Comments  NOTE Dr. Silvio Pate is [facility] office Monday but does not have EARLY AM appts and Nurse feels patient needs to come in EARLY to be seen while fasting and get POSSIBLY A HGA1C done. APPT with Webb Silversmith NP at 1000am   Referrals  REFERRED TO PCP OFFICE

## 2016-01-05 NOTE — Telephone Encounter (Signed)
Pt has appt with R Baity NP on 01/08/16 at 10 AM. Pt said if condition changes or worsens prior to appt pt will call [place].

## 2016-01-08 ENCOUNTER — Ambulatory Visit (INDEPENDENT_AMBULATORY_CARE_PROVIDER_SITE_OTHER): Payer: Medicare Other | Admitting: Internal Medicine

## 2016-01-08 ENCOUNTER — Encounter: Payer: Self-pay | Admitting: Internal Medicine

## 2016-01-08 VITALS — BP 122/76 | HR 76 | Temp 97.6°F | Wt 212.0 lb

## 2016-01-08 DIAGNOSIS — E1165 Type 2 diabetes mellitus with hyperglycemia: Secondary | ICD-10-CM | POA: Diagnosis not present

## 2016-01-08 DIAGNOSIS — R8299 Other abnormal findings in urine: Secondary | ICD-10-CM

## 2016-01-08 DIAGNOSIS — L298 Other pruritus: Secondary | ICD-10-CM | POA: Diagnosis not present

## 2016-01-08 DIAGNOSIS — N898 Other specified noninflammatory disorders of vagina: Secondary | ICD-10-CM

## 2016-01-08 DIAGNOSIS — R8290 Unspecified abnormal findings in urine: Secondary | ICD-10-CM

## 2016-01-08 DIAGNOSIS — R82998 Other abnormal findings in urine: Secondary | ICD-10-CM

## 2016-01-08 LAB — POC URINALSYSI DIPSTICK (AUTOMATED)
Bilirubin, UA: NEGATIVE
Blood, UA: NEGATIVE
Glucose, UA: NEGATIVE
KETONES UA: NEGATIVE
LEUKOCYTES UA: NEGATIVE
NITRITE UA: NEGATIVE
PH UA: 6
PROTEIN UA: NEGATIVE
Spec Grav, UA: 1.02
UROBILINOGEN UA: 0.2

## 2016-01-08 LAB — HEMOGLOBIN A1C: HEMOGLOBIN A1C: 7.5 % — AB (ref 4.6–6.5)

## 2016-01-08 MED ORDER — FLUCONAZOLE 150 MG PO TABS: 150.0000 mg | ORAL_TABLET | Freq: Once | ORAL | Status: DC

## 2016-01-08 NOTE — Patient Instructions (Signed)
Hyperglycemia °Hyperglycemia occurs when the glucose (sugar) in your blood is too high. Hyperglycemia can happen for many reasons, but it most often happens to people who do not know they have diabetes or are not managing their diabetes properly.  °CAUSES  °Whether you have diabetes or not, there are other causes of hyperglycemia. Hyperglycemia can occur when you have diabetes, but it can also occur in other situations that you might not be as aware of, such as: °Diabetes °· If you have diabetes and are having problems controlling your blood glucose, hyperglycemia could occur because of some of the following reasons: °¨ Not following your meal plan. °¨ Not taking your diabetes medications or not taking it properly. °¨ Exercising less or doing less activity than you normally do. °¨ Being sick. °Pre-diabetes °· This cannot be ignored. Before people develop Type 2 diabetes, they almost always have "pre-diabetes." This is when your blood glucose levels are higher than normal, but not yet high enough to be diagnosed as diabetes. Research has shown that some long-term damage to the body, especially the heart and circulatory system, may already be occurring during pre-diabetes. If you take action to manage your blood glucose when you have pre-diabetes, you may delay or prevent Type 2 diabetes from developing. °Stress °· If you have diabetes, you may be "diet" controlled or on oral medications or insulin to control your diabetes. However, you may find that your blood glucose is higher than usual in the hospital whether you have diabetes or not. This is often referred to as "stress hyperglycemia." Stress can elevate your blood glucose. This happens because of hormones put out by the body during times of stress. If stress has been the cause of your high blood glucose, it can be followed regularly by your caregiver. That way he/she can make sure your hyperglycemia does not continue to get worse or progress to  diabetes. °Steroids °· Steroids are medications that act on the infection fighting system (immune system) to block inflammation or infection. One side effect can be a rise in blood glucose. Most people can produce enough extra insulin to allow for this rise, but for those who cannot, steroids make blood glucose levels go even higher. It is not unusual for steroid treatments to "uncover" diabetes that is developing. It is not always possible to determine if the hyperglycemia will go away after the steroids are stopped. A special blood test called an A1c is sometimes done to determine if your blood glucose was elevated before the steroids were started. °SYMPTOMS °· Thirsty. °· Frequent urination. °· Dry mouth. °· Blurred vision. °· Tired or fatigue. °· Weakness. °· Sleepy. °· Tingling in feet or leg. °DIAGNOSIS  °Diagnosis is made by monitoring blood glucose in one or all of the following ways: °· A1c test. This is a chemical found in your blood. °· Fingerstick blood glucose monitoring. °· Laboratory results. °TREATMENT  °First, knowing the cause of the hyperglycemia is important before the hyperglycemia can be treated. Treatment may include, but is not be limited to: °· Education. °· Change or adjustment in medications. °· Change or adjustment in meal plan. °· Treatment for an illness, infection, etc. °· More frequent blood glucose monitoring. °· Change in exercise plan. °· Decreasing or stopping steroids. °· Lifestyle changes. °HOME CARE INSTRUCTIONS  °· Test your blood glucose as directed. °· Exercise regularly. Your caregiver will give you instructions about exercise. Pre-diabetes or diabetes which comes on with stress is helped by exercising. °· Eat wholesome,   balanced meals. Eat often and at regular, fixed times. Your caregiver or nutritionist will give you a meal plan to guide your sugar intake. °· Being at an ideal weight is important. If needed, losing as little as 10 to 15 pounds may help improve blood  glucose levels. °SEEK MEDICAL CARE IF:  °· You have questions about medicine, activity, or diet. °· You continue to have symptoms (problems such as increased thirst, urination, or weight gain). °SEEK IMMEDIATE MEDICAL CARE IF:  °· You are vomiting or have diarrhea. °· Your breath smells fruity. °· You are breathing faster or slower. °· You are very sleepy or incoherent. °· You have numbness, tingling, or pain in your feet or hands. °· You have chest pain. °· Your symptoms get worse even though you have been following your caregiver's orders. °· If you have any other questions or concerns. °  °This information is not intended to replace advice given to you by your health care provider. Make sure you discuss any questions you have with your health care provider. °  °Document Released: 04/02/2001 Document Revised: 12/30/2011 Document Reviewed: 06/13/2015 °Elsevier Interactive Patient Education ©2016 [employer] ° °

## 2016-01-08 NOTE — Addendum Note (Signed)
Addended by: Lurlean Nanny on: 01/08/2016 01:47 PM   Modules accepted: Orders
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
## 2016-01-08 NOTE — Progress Notes (Signed)
Pre visit review using our clinic review tool, if applicable. No additional management support is needed unless otherwise documented below in the visit note. 

## 2016-02-01 ENCOUNTER — Other Ambulatory Visit: Payer: Self-pay | Admitting: Internal Medicine

## 2016-02-01 NOTE — Telephone Encounter (Signed)
Last refill 01-02-16 #90/0. Last OV 01-08-16 Next OV 04-11-16

## 2016-02-02 NOTE — Telephone Encounter (Signed)
Approved: #[phone] x 0 

## 2016-02-05 NOTE — Telephone Encounter (Signed)
Left refill on voice mail at pharmacy  

## 2016-02-26 ENCOUNTER — Telehealth: Payer: Self-pay | Admitting: Internal Medicine

## 2016-02-26 MED ORDER — GLUCOSE BLOOD VI STRP: ORAL_STRIP | Status: DC

## 2016-02-26 NOTE — Telephone Encounter (Signed)
Sent strips to pharmacy. Spoke to patient. Her insurance will not cover more than 1 a day because she is not insulin dependent. She has been checking it up to 3 times a day since April 20th and it has been up since then. She is very stressed out. She said how high is too high?

## 2016-02-26 NOTE — Telephone Encounter (Signed)
Her A1c was okay recently. Please refill her strips and let me know if they stay up--have her check bid for a few days to see what is going on

## 2016-02-26 NOTE — Telephone Encounter (Signed)
If it is regularly over 200 in the morning, we need to address the problem. Fasting is the easiest to interpret---she may want to check before dinner also occasionally (and can skip some days so she averages 1 per day)

## 2016-02-26 NOTE — Telephone Encounter (Signed)
Spoke to patient. She will alternate checking her blood sugar. If it stays up, she will come in.

## 2016-02-26 NOTE — Telephone Encounter (Signed)
Pt want to know if she needs to have appt about blood sugar, they have been running high - the highest is 245  Pt is now out of strips, and they wont refill it.  cb number [phone]

## 2016-03-03 ENCOUNTER — Other Ambulatory Visit: Payer: Self-pay | Admitting: Internal Medicine

## 2016-03-04 NOTE — Telephone Encounter (Signed)
Left refill on voice mail at pharmacy  

## 2016-03-04 NOTE — Telephone Encounter (Signed)
Last filled 02-05-16 #90 Last OV 10-05-15 Next OV 04-11-16

## 2016-03-04 NOTE — Telephone Encounter (Signed)
Approved: #[phone] x 0 

## 2016-03-10 ENCOUNTER — Other Ambulatory Visit: Payer: Self-pay | Admitting: Internal Medicine

## 2016-04-03 ENCOUNTER — Other Ambulatory Visit: Payer: Self-pay | Admitting: Internal Medicine

## 2016-04-03 NOTE — Telephone Encounter (Signed)
Last filled 03-06-16 #90 Last OV 01-08-16 Next OV 04-11-16

## 2016-04-03 NOTE — Telephone Encounter (Signed)
Left refill on voice mail at pharmacy  

## 2016-04-03 NOTE — Telephone Encounter (Signed)
Approved: #[phone] x 0 

## 2016-04-08 ENCOUNTER — Other Ambulatory Visit: Payer: Self-pay | Admitting: Internal Medicine

## 2016-04-11 ENCOUNTER — Encounter: Payer: Self-pay | Admitting: Internal Medicine

## 2016-04-11 ENCOUNTER — Ambulatory Visit (INDEPENDENT_AMBULATORY_CARE_PROVIDER_SITE_OTHER): Payer: Medicare Other | Admitting: Internal Medicine

## 2016-04-11 VITALS — BP 110/66 | HR 97 | Temp 98.0°F | Ht 63.0 in | Wt 211.0 lb

## 2016-04-11 DIAGNOSIS — F39 Unspecified mood [affective] disorder: Secondary | ICD-10-CM | POA: Diagnosis not present

## 2016-04-11 DIAGNOSIS — Z0000 Encounter for general adult medical examination without abnormal findings: Secondary | ICD-10-CM | POA: Diagnosis not present

## 2016-04-11 DIAGNOSIS — E1140 Type 2 diabetes mellitus with diabetic neuropathy, unspecified: Secondary | ICD-10-CM | POA: Diagnosis not present

## 2016-04-11 DIAGNOSIS — I10 Essential (primary) hypertension: Secondary | ICD-10-CM | POA: Diagnosis not present

## 2016-04-11 DIAGNOSIS — I6990 Unspecified sequelae of unspecified cerebrovascular disease: Secondary | ICD-10-CM | POA: Diagnosis not present

## 2016-04-11 DIAGNOSIS — Z7189 Other specified counseling: Secondary | ICD-10-CM

## 2016-04-11 LAB — HM DIABETES FOOT EXAM

## 2016-04-11 NOTE — Assessment & Plan Note (Signed)
Hopefully still good Will recheck

## 2016-04-11 NOTE — Assessment & Plan Note (Signed)
Mostly recovered

## 2016-04-11 NOTE — Assessment & Plan Note (Signed)
Has DNR 

## 2016-04-11 NOTE — Assessment & Plan Note (Signed)
BP Readings from Last 3 Encounters:  04/11/16 110/66  01/08/16 122/76  09/27/15 120/80   Good control

## 2016-04-11 NOTE — Assessment & Plan Note (Signed)
I have personally reviewed the Medicare Annual Wellness questionnaire and have noted 1. The patient's medical and social history 2. Their use of alcohol, tobacco or illicit drugs 3. Their current medications and supplements 4. The patient's functional ability including ADL's, fall risks, home safety risks and hearing or visual             impairment. 5. Diet and physical activities 6. Evidence for depression or mood disorders  The patients weight, height, BMI and visual acuity have been recorded in the chart I have made referrals, counseling and provided education to the patient based review of the above and I have provided the pt with a written personalized care plan for preventive services.  I have provided you with a copy of your personalized plan for preventive services. Please take the time to review along with your updated medication list.  Yearly flu vaccines Not sure about mammogram---wouldn't be due till next year Done with colons
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
## 2016-04-11 NOTE — Assessment & Plan Note (Signed)
Ongoing situational issues and some anhedonia--misses children No change in meds needed (just prn)

## 2016-04-11 NOTE — Progress Notes (Signed)
Pre visit review using our clinic review tool, if applicable. No additional management support is needed unless otherwise documented below in the visit note. 

## 2016-04-12 LAB — MICROALBUMIN / CREATININE URINE RATIO
CREATININE, U: 43.8 mg/dL
Microalb Creat Ratio: 1.6 mg/g (ref 0.0–30.0)

## 2016-04-12 LAB — HEMOGLOBIN A1C: Hgb A1c MFr Bld: 7.3 % — ABNORMAL HIGH (ref 4.6–6.5)

## 2016-05-01 ENCOUNTER — Other Ambulatory Visit: Payer: Self-pay | Admitting: Internal Medicine

## 2016-05-02 NOTE — Telephone Encounter (Signed)
Approved: #[phone] x 0 

## 2016-05-02 NOTE — Telephone Encounter (Signed)
Last filled 04-03-16 Last OV 04-11-16 No Future OV

## 2016-05-02 NOTE — Telephone Encounter (Signed)
Left refill on voice mail at pharmacy  

## 2016-05-08 ENCOUNTER — Ambulatory Visit (INDEPENDENT_AMBULATORY_CARE_PROVIDER_SITE_OTHER): Payer: Medicare Other | Admitting: Primary Care

## 2016-05-08 ENCOUNTER — Encounter: Payer: Self-pay | Admitting: Primary Care

## 2016-05-08 VITALS — BP 116/72 | HR 88 | Temp 97.7°F | Ht 63.0 in | Wt 209.8 lb

## 2016-05-08 DIAGNOSIS — R319 Hematuria, unspecified: Secondary | ICD-10-CM

## 2016-05-08 DIAGNOSIS — N390 Urinary tract infection, site not specified: Secondary | ICD-10-CM

## 2016-05-08 LAB — POC URINALSYSI DIPSTICK (AUTOMATED)
BILIRUBIN UA: NEGATIVE
GLUCOSE UA: NEGATIVE
Ketones, UA: NEGATIVE
NITRITE UA: NEGATIVE
Spec Grav, UA: 1.02
Urobilinogen, UA: NEGATIVE
pH, UA: 6

## 2016-05-08 MED ORDER — CEPHALEXIN 500 MG PO CAPS: 500.0000 mg | ORAL_CAPSULE | Freq: Two times a day (BID) | ORAL | Status: DC

## 2016-05-08 NOTE — Addendum Note (Signed)
Addended by: Pleas Koch on: 05/08/2016 05:44 PM   Modules accepted: Miquel Dunn
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
## 2016-05-08 NOTE — Progress Notes (Signed)
Pre visit review using our clinic review tool, if applicable. No additional management support is needed unless otherwise documented below in the visit note. 

## 2016-05-08 NOTE — Patient Instructions (Addendum)
Start Cephalexin antibiotics. Take 1 capsule by mouth twice daily for 7 days.  Ensure you are staying hydrated with water.   Please call me if you develop a yeast infection once you've completed the antibiotics.   It was a pleasure meeting you!

## 2016-05-11 LAB — URINE CULTURE: Colony Count: >=100000

## 2016-05-24 DIAGNOSIS — J0110 Acute frontal sinusitis, unspecified: Secondary | ICD-10-CM | POA: Diagnosis not present

## 2016-05-30 ENCOUNTER — Other Ambulatory Visit: Payer: Self-pay | Admitting: Internal Medicine

## 2016-06-04 ENCOUNTER — Other Ambulatory Visit: Payer: Self-pay | Admitting: Internal Medicine

## 2016-06-04 DIAGNOSIS — Z1231 Encounter for screening mammogram for malignant neoplasm of breast: Secondary | ICD-10-CM

## 2016-06-06 ENCOUNTER — Other Ambulatory Visit: Payer: Self-pay | Admitting: Internal Medicine

## 2016-06-07 NOTE — Telephone Encounter (Signed)
Approved: #[phone] x 0 

## 2016-06-07 NOTE — Telephone Encounter (Signed)
Last filled on  05/02/16, last OV 04/11/16. Ok to refill?

## 2016-06-09 ENCOUNTER — Other Ambulatory Visit: Payer: Self-pay | Admitting: Internal Medicine

## 2016-06-10 NOTE — Telephone Encounter (Signed)
Left refill on voice mail at pharmacy  

## 2016-06-13 ENCOUNTER — Other Ambulatory Visit: Payer: Self-pay | Admitting: Internal Medicine

## 2016-06-13 ENCOUNTER — Ambulatory Visit
Admission: RE | Admit: 2016-06-13 | Discharge: 2016-06-13 | Disposition: A | Discharge status: Home / Self Care | Payer: Medicare Other | Source: Ambulatory Visit | Attending: Internal Medicine | Admitting: Internal Medicine

## 2016-06-13 DIAGNOSIS — Z1231 Encounter for screening mammogram for malignant neoplasm of breast: Secondary | ICD-10-CM

## 2016-06-13 DIAGNOSIS — E119 Type 2 diabetes mellitus without complications: Secondary | ICD-10-CM | POA: Diagnosis not present

## 2016-06-13 LAB — HM DIABETES EYE EXAM

## 2016-06-26 ENCOUNTER — Encounter: Payer: Self-pay | Admitting: Internal Medicine

## 2016-07-07 ENCOUNTER — Other Ambulatory Visit: Payer: Self-pay | Admitting: Internal Medicine

## 2016-07-08 NOTE — Telephone Encounter (Signed)
Left refill on voice mail at pharmacy  

## 2016-07-08 NOTE — Telephone Encounter (Signed)
Approved: #90 x 0 Probably needs December follow up

## 2016-07-08 NOTE — Telephone Encounter (Signed)
Last filled 06-10-16 #90 Last OV 04-11-16 No Future OV

## 2016-07-08 NOTE — Telephone Encounter (Signed)
Left detailed message on VM per DPR that pt needed to call the office and schedule a 6 month f/u OV for late December

## 2016-07-10 DIAGNOSIS — Z23 Encounter for immunization: Secondary | ICD-10-CM | POA: Diagnosis not present

## 2016-07-20 DIAGNOSIS — L309 Dermatitis, unspecified: Secondary | ICD-10-CM | POA: Diagnosis not present

## 2016-07-20 DIAGNOSIS — B372 Candidiasis of skin and nail: Secondary | ICD-10-CM | POA: Diagnosis not present

## 2016-08-05 ENCOUNTER — Other Ambulatory Visit: Payer: Self-pay | Admitting: Internal Medicine

## 2016-08-06 NOTE — Telephone Encounter (Signed)
Left refill on voice mail at pharmacy  

## 2016-08-06 NOTE — Telephone Encounter (Signed)
Approved: #[phone] x 0 

## 2016-08-06 NOTE — Telephone Encounter (Signed)
Last filled 07-09-16 #90 Last OV 04-11-16 Next OV 09-25-16

## 2016-09-02 ENCOUNTER — Encounter: Payer: Self-pay | Admitting: Primary Care

## 2016-09-02 ENCOUNTER — Telehealth: Payer: Self-pay

## 2016-09-02 ENCOUNTER — Ambulatory Visit (INDEPENDENT_AMBULATORY_CARE_PROVIDER_SITE_OTHER): Payer: Medicare Other | Admitting: Primary Care

## 2016-09-02 VITALS — Ht 63.0 in | Wt 212.4 lb

## 2016-09-02 DIAGNOSIS — L299 Pruritus, unspecified: Secondary | ICD-10-CM | POA: Diagnosis not present

## 2016-09-02 DIAGNOSIS — F411 Generalized anxiety disorder: Secondary | ICD-10-CM | POA: Diagnosis not present

## 2016-09-02 DIAGNOSIS — R300 Dysuria: Secondary | ICD-10-CM | POA: Diagnosis not present

## 2016-09-02 LAB — POC URINALSYSI DIPSTICK (AUTOMATED)
BILIRUBIN UA: NEGATIVE
Glucose, UA: NEGATIVE
Ketones, UA: NEGATIVE
NITRITE UA: NEGATIVE
PH UA: 6
PROTEIN UA: NEGATIVE
RBC UA: NEGATIVE
Spec Grav, UA: >=1.03
UROBILINOGEN UA: NEGATIVE

## 2016-09-02 MED ORDER — METHYLPREDNISOLONE ACETATE 80 MG/ML IJ SUSP
80.0000 mg | Freq: Once | INTRAMUSCULAR | Status: AC
  Administered 2016-09-02: 80 mg via INTRAMUSCULAR

## 2016-09-02 NOTE — Addendum Note (Signed)
Addended by: Jacqualin Combes on: 09/02/2016 02:25 PM   Modules accepted: Orders
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
## 2016-09-02 NOTE — Telephone Encounter (Signed)
Pt left v/m requesting cb to see if Sheila Bossier NP got intouch with Dr Silvio Pate.Please advise.
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
## 2016-09-02 NOTE — Patient Instructions (Signed)
You were provided with an injection of steroids to help reduce your itching.  I will be in touch with Dr. Silvio Pate and provide you with an answer as soon as possible.  We will send your urine off for further evaluation.  It was a pleasure meeting you!

## 2016-09-02 NOTE — Telephone Encounter (Signed)
Patient was seen today by Anda Kraft.  Patient said Anda Kraft was going to call her back this afternoon.  Patient said she can be reached on her cell phone [phone], but if you can't reach her on her cell phone she can be reached on her sister's cell [phone].

## 2016-09-02 NOTE — Progress Notes (Signed)
Pre visit review using our clinic review tool, if applicable. No additional management support is needed unless otherwise documented below in the visit note. 

## 2016-09-03 ENCOUNTER — Telehealth: Payer: Self-pay

## 2016-09-03 MED ORDER — LORAZEPAM 0.5 MG PO TABS: 0.5000 mg | ORAL_TABLET | Freq: Three times a day (TID) | ORAL | 0 refills | Status: DC | PRN

## 2016-09-03 NOTE — Telephone Encounter (Signed)
Left refill on voice mail at pharmacy Spoke to pt.

## 2016-09-03 NOTE — Telephone Encounter (Signed)
Spoke to pt in regards to the clorazepate backorder. She has never tried lorazepam, but has been on Diazepam 5mg  in the past and it really helped. She is leaving for a parole hearing in [place] right now and [place] If we call in a new rx, [facility] [place]

## 2016-09-03 NOTE — Telephone Encounter (Signed)
Let her know that diazepam (valium) is not recommended for anyone over 65 since it can build up in your system and is more prone to cause side effects. I would first try lorazepam 0.5mg  tid prn (if clorazepate remains unavailabe) #90 x 0 if she needs now

## 2016-09-03 NOTE — Telephone Encounter (Signed)
Issue has been addressed in phone with Dr Silvio Pate.

## 2016-09-04 LAB — URINE CULTURE: Organism ID, Bacteria: NO GROWTH

## 2016-09-25 ENCOUNTER — Ambulatory Visit: Payer: Medicare Other | Admitting: Internal Medicine

## 2016-09-27 ENCOUNTER — Ambulatory Visit: Payer: Medicare Other | Admitting: Internal Medicine

## 2016-09-27 ENCOUNTER — Encounter: Payer: Self-pay | Admitting: Internal Medicine

## 2016-09-27 ENCOUNTER — Ambulatory Visit (INDEPENDENT_AMBULATORY_CARE_PROVIDER_SITE_OTHER): Payer: Medicare Other | Admitting: Internal Medicine

## 2016-09-27 VITALS — BP 138/82 | HR 99 | Temp 97.5°F | Wt 204.0 lb

## 2016-09-27 DIAGNOSIS — F39 Unspecified mood [affective] disorder: Secondary | ICD-10-CM

## 2016-09-27 DIAGNOSIS — E1140 Type 2 diabetes mellitus with diabetic neuropathy, unspecified: Secondary | ICD-10-CM

## 2016-09-27 DIAGNOSIS — I10 Essential (primary) hypertension: Secondary | ICD-10-CM

## 2016-09-27 LAB — COMPREHENSIVE METABOLIC PANEL
ALBUMIN: 4.6 g/dL (ref 3.5–5.2)
ALK PHOS: 80 U/L (ref 39–117)
ALT: 11 U/L (ref 0–35)
AST: 19 U/L (ref 0–37)
BILIRUBIN TOTAL: 0.4 mg/dL (ref 0.2–1.2)
BUN: 16 mg/dL (ref 6–23)
CALCIUM: 10.4 mg/dL (ref 8.4–10.5)
CO2: 31 mEq/L (ref 19–32)
Chloride: 100 mEq/L (ref 96–112)
Creatinine, Ser: 0.88 mg/dL (ref 0.40–1.20)
GFR: 66.04 mL/min (ref >60.00)
GLUCOSE: 95 mg/dL (ref 70–99)
Potassium: 4.6 mEq/L (ref 3.5–5.1)
Sodium: 139 mEq/L (ref 135–145)
TOTAL PROTEIN: 7.8 g/dL (ref 6.0–8.3)

## 2016-09-27 LAB — LIPID PANEL
CHOLESTEROL: 212 mg/dL — AB (ref 0–200)
HDL: 62.3 mg/dL (ref >39.00)
LDL Cholesterol: 122 mg/dL — ABNORMAL HIGH (ref 0–99)
NonHDL: 149.28
Total CHOL/HDL Ratio: 3
Triglycerides: 137 mg/dL (ref 0.0–149.0)
VLDL: 27.4 mg/dL (ref 0.0–40.0)

## 2016-09-27 LAB — CBC WITH DIFFERENTIAL/PLATELET
BASOS ABS: 0.1 10*3/uL (ref 0.0–0.1)
Basophils Relative: 0.7 % (ref 0.0–3.0)
Eosinophils Absolute: 0.5 10*3/uL (ref 0.0–0.7)
Eosinophils Relative: 4.2 % (ref 0.0–5.0)
HCT: 44.4 % (ref 36.0–46.0)
HEMOGLOBIN: 14.6 g/dL (ref 12.0–15.0)
LYMPHS ABS: 4.1 10*3/uL — AB (ref 0.7–4.0)
Lymphocytes Relative: 35.5 % (ref 12.0–46.0)
MCHC: 32.8 g/dL (ref 30.0–36.0)
MCV: 86.3 fl (ref 78.0–100.0)
Monocytes Absolute: 0.9 10*3/uL (ref 0.1–1.0)
Monocytes Relative: 8.1 % (ref 3.0–12.0)
NEUTROS PCT: 51.5 % (ref 43.0–77.0)
Neutro Abs: 5.9 10*3/uL (ref 1.4–7.7)
Platelets: 344 10*3/uL (ref 150.0–400.0)
RBC: 5.14 Mil/uL — AB (ref 3.87–5.11)
RDW: 14.9 % (ref 11.5–15.5)
WBC: 11.5 10*3/uL — AB (ref 4.0–10.5)

## 2016-09-27 LAB — HEMOGLOBIN A1C: Hgb A1c MFr Bld: 6.9 % — ABNORMAL HIGH (ref 4.6–6.5)

## 2016-09-27 MED ORDER — CLORAZEPATE DIPOTASSIUM 7.5 MG PO TABS
7.5000 mg | ORAL_TABLET | Freq: Three times a day (TID) | ORAL | 0 refills | Status: DC | PRN
Start: 2016-09-27 — End: 2016-10-24

## 2016-09-27 NOTE — Progress Notes (Signed)
Pre visit review using our clinic review tool, if applicable. No additional management support is needed unless otherwise documented below in the visit note. 
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
## 2016-09-27 NOTE — Assessment & Plan Note (Signed)
Still seems to have good control 

## 2016-09-27 NOTE — Assessment & Plan Note (Signed)
BP Readings from Last 3 Encounters:  09/27/16 138/82  05/08/16 116/72  04/11/16 110/66   Still good control

## 2016-09-27 NOTE — Assessment & Plan Note (Signed)
Still with lots of family stress Will switch back to clorazepate

## 2016-10-04 ENCOUNTER — Other Ambulatory Visit: Payer: Self-pay | Admitting: Internal Medicine

## 2016-10-24 ENCOUNTER — Other Ambulatory Visit: Payer: Self-pay

## 2016-10-24 MED ORDER — CLORAZEPATE DIPOTASSIUM 7.5 MG PO TABS: 7.5000 mg | ORAL_TABLET | Freq: Three times a day (TID) | ORAL | 0 refills | Status: DC | PRN

## 2016-10-24 NOTE — Telephone Encounter (Signed)
Approved: #[phone] x 0 

## 2016-10-24 NOTE — Telephone Encounter (Signed)
Last filled 09-27-16 #90 Last ov 09-27-16 No Future OV

## 2016-10-24 NOTE — Telephone Encounter (Signed)
Left refill on voice mail at pharmacy  

## 2016-11-08 DIAGNOSIS — L0889 Other specified local infections of the skin and subcutaneous tissue: Secondary | ICD-10-CM | POA: Diagnosis not present

## 2016-11-08 DIAGNOSIS — B86 Scabies: Secondary | ICD-10-CM | POA: Diagnosis not present

## 2016-11-15 ENCOUNTER — Other Ambulatory Visit: Payer: Self-pay | Admitting: Internal Medicine

## 2016-11-26 ENCOUNTER — Other Ambulatory Visit: Payer: Self-pay

## 2016-11-26 MED ORDER — CLORAZEPATE DIPOTASSIUM 7.5 MG PO TABS: 7.5000 mg | ORAL_TABLET | Freq: Three times a day (TID) | ORAL | 0 refills | Status: DC | PRN

## 2016-11-26 NOTE — Telephone Encounter (Signed)
Approved: #90 x 0 Let her know how sorry I am to hear about her son

## 2016-11-26 NOTE — Telephone Encounter (Signed)
Left refill on voice mail at pharmacy  Spoke to pt.

## 2016-11-26 NOTE — Telephone Encounter (Signed)
Pt left v/m [facility] [place]. Pt last seen 09/27/16 and rx last refilled # 90 x 0 on 10/24/16. Pt wanted Dr Silvio Pate to know that pts son's cancer has come back and pt's son given 6 months to live.

## 2016-12-14 DIAGNOSIS — R05 Cough: Secondary | ICD-10-CM | POA: Diagnosis not present

## 2016-12-14 DIAGNOSIS — J0140 Acute pansinusitis, unspecified: Secondary | ICD-10-CM | POA: Diagnosis not present

## 2016-12-23 ENCOUNTER — Other Ambulatory Visit: Payer: Self-pay | Admitting: Internal Medicine

## 2016-12-23 NOTE — Telephone Encounter (Signed)
Last filled 11/26/16. Last OV 09/2016

## 2016-12-24 NOTE — Telephone Encounter (Signed)
Rx called in to requested pharmacy 

## 2016-12-24 NOTE — Telephone Encounter (Signed)
Ok to phone in Tranxene 

## 2017-01-22 ENCOUNTER — Other Ambulatory Visit: Payer: Self-pay | Admitting: Internal Medicine

## 2017-01-23 NOTE — Telephone Encounter (Signed)
Approved: #90 x 0 She needs to set up appt in June or July (see if due for AMW)

## 2017-01-23 NOTE — Telephone Encounter (Signed)
Left a message on vm that she needs to call the office and schedule her awv. Left refill on voice mail at pharmacy

## 2017-01-23 NOTE — Telephone Encounter (Signed)
Last filled 12-25-16 #90 Last OV 09-27-16 No future OV

## 2017-01-23 NOTE — Telephone Encounter (Signed)
Left refill on voice mail at pharmacy  

## 2017-02-06 ENCOUNTER — Ambulatory Visit (INDEPENDENT_AMBULATORY_CARE_PROVIDER_SITE_OTHER): Payer: Medicare Other | Admitting: Family Medicine

## 2017-02-06 ENCOUNTER — Encounter: Payer: Self-pay | Admitting: Family Medicine

## 2017-02-06 ENCOUNTER — Encounter (INDEPENDENT_AMBULATORY_CARE_PROVIDER_SITE_OTHER): Payer: Self-pay

## 2017-02-06 DIAGNOSIS — R0981 Nasal congestion: Secondary | ICD-10-CM

## 2017-02-06 DIAGNOSIS — R21 Rash and other nonspecific skin eruption: Secondary | ICD-10-CM | POA: Diagnosis not present

## 2017-02-06 MED ORDER — BETAMETHASONE DIPROPIONATE 0.05 % EX CREA: TOPICAL_CREAM | Freq: Two times a day (BID) | CUTANEOUS | 0 refills | Status: DC

## 2017-02-06 MED ORDER — HYDROXYZINE HCL 25 MG PO TABS: 25.0000 mg | ORAL_TABLET | Freq: Three times a day (TID) | ORAL | 1 refills | Status: AC | PRN

## 2017-02-06 NOTE — Assessment & Plan Note (Signed)
Likely rash due to severe dry skin, eczema variant and constant itching. Likely worsened with stress.  No sign of yeast or scabies.  Treat with hydoxyzine, topical steroid and moisturizing cream.

## 2017-02-06 NOTE — Progress Notes (Signed)
Pre visit review using our clinic review tool, if applicable. No additional management support is needed unless otherwise documented below in the visit note. 

## 2017-02-06 NOTE — Addendum Note (Signed)
Addended by: Eliezer Lofts E on: 02/06/2017 04:11 PM   Modules accepted: Orders

## 2017-02-06 NOTE — Progress Notes (Signed)
   Subjective:    Patient ID: Sheila Cunningham, female    DOB: 1936-03-13, 81 y.o.   MRN: [id number]   She has had a rash an itching for many months oin chest and back.[facility] multiple times for this.  stress and scraching.. Given depo-medrol which helped temporarily.  06/2016 dx wth skin candidiasis; treated with nystatin. 08/2016  Felt due to  Stress, improved with steroid injection.  10/2016  Dx scabies and secondary infection.. Treated with cephalexin and permethrin cream, hydroxzine:  Helped some but them when stopped itching came back.   Rash  This is a new problem. The current episode started more than 1 month ago. The problem has been gradually worsening since onset. The affected locations include the chest and back. The rash is characterized by itchiness, redness and scaling (no blister). She was exposed to nothing. Pertinent negatives include no fatigue, fever or shortness of breath. Past treatments include antihistamine (see narrative, benadryl). The treatment provided moderate relief. There is no history of allergies, asthma, eczema or varicella.    In last 5 days she has had congestion in nose, face pain, productive cough.  No fever.  No SOB.  No ear pain.  Using astelin and loratidine.  Review of Systems  Constitutional: Negative for fatigue and fever.  Respiratory: Negative for shortness of breath.   Skin: Positive for rash.       Objective:   Physical Exam  Constitutional: Vital signs are normal. She appears well-developed and well-nourished. She is cooperative.  Non-toxic appearance. She does not appear ill. No distress.  HENT:  Head: Normocephalic.  Right Ear: Hearing, tympanic membrane, external ear and ear canal normal. Tympanic membrane is not erythematous, not retracted and not bulging.  Left Ear: Hearing, tympanic membrane, external ear and ear canal normal. Tympanic membrane is not erythematous, not retracted and not bulging.  Nose:  Mucosal edema and rhinorrhea present. Right sinus exhibits no maxillary sinus tenderness and no frontal sinus tenderness. Left sinus exhibits no maxillary sinus tenderness and no frontal sinus tenderness.  Mouth/Throat: Uvula is midline, oropharynx is clear and moist and mucous membranes are normal.  Eyes: Conjunctivae, EOM and lids are normal. Pupils are equal, round, and reactive to light. Lids are everted and swept, no foreign bodies found.  Neck: Trachea normal and normal range of motion. Neck supple. Carotid bruit is not present. No thyroid mass and no thyromegaly present.  Cardiovascular: Normal rate, regular rhythm, S1 normal, S2 normal, normal heart sounds, intact distal pulses and normal pulses.  Exam reveals no gallop and no friction rub.   No murmur heard. Pulmonary/Chest: Effort normal and breath sounds normal. No tachypnea. No respiratory distress. She has no decreased breath sounds. She has no wheezes. She has no rhonchi. She has no rales.  Abdominal: Soft. Normal appearance and bowel sounds are normal. There is no tenderness.  Neurological: She is alert.  Skin: Skin is warm, dry and intact. Rash noted. Rash is maculopapular.   Erythematous macules and patches where pt can reach on thighs, shoulders and chest, excoriations  very dry skin  Psychiatric: Her speech is normal and behavior is normal. Judgment and thought content normal. Her mood appears not anxious. Cognition and memory are normal. She does not exhibit a depressed mood.          Assessment & Plan:

## 2017-02-06 NOTE — Patient Instructions (Signed)
Start mosturizing cream  ( not lotion) like  cetaphil cream daily to twice daily. Avoid hot baths.  Use hypoallergenic detergent.  Use hydralazine as needed for itching.  Start topical steroid cream twice daily of affected areas.. Call if difficult to spread.  Continue allergy meds for current viral or allergy causing runny nose.  if not getting better in 2 weeks call for referral to dermatologist.

## 2017-02-06 NOTE — Assessment & Plan Note (Signed)
Likely due ot allergies vs viral infeciton. Conitnue current meds

## 2017-02-11 ENCOUNTER — Ambulatory Visit: Payer: Medicare Other | Admitting: Internal Medicine

## 2017-02-20 ENCOUNTER — Other Ambulatory Visit: Payer: Self-pay | Admitting: Internal Medicine

## 2017-02-20 NOTE — Telephone Encounter (Signed)
Approved: #[phone] x 0 

## 2017-02-20 NOTE — Telephone Encounter (Signed)
Patient called to schedule her AWV.  Patient wanted to let Dr.Letvak,Shannon and Shirlean Mylar know her son has cancer and isn't doing well.  Patient said she's traveling  back and forth visiting him.

## 2017-02-20 NOTE — Telephone Encounter (Signed)
Last filled 01-21-17 #90 Last OV 09-27-16 Next OV 04-14-17

## 2017-02-20 NOTE — Telephone Encounter (Signed)
Left refill on voice mail at pharmacy  

## 2017-03-24 ENCOUNTER — Other Ambulatory Visit: Payer: Self-pay | Admitting: Internal Medicine

## 2017-03-24 NOTE — Telephone Encounter (Signed)
Last filled 02-21-17 #90 Last OV Acute 02-06-17 Next OV 04-14-17   No UDS

## 2017-03-24 NOTE — Telephone Encounter (Signed)
Approved: #[phone] x 0 

## 2017-03-24 NOTE — Telephone Encounter (Signed)
Left refill on voice mail at pharmacy  

## 2017-04-14 ENCOUNTER — Ambulatory Visit (INDEPENDENT_AMBULATORY_CARE_PROVIDER_SITE_OTHER): Payer: Medicare Other | Admitting: Internal Medicine

## 2017-04-14 ENCOUNTER — Encounter: Payer: Self-pay | Admitting: Internal Medicine

## 2017-04-14 VITALS — BP 118/70 | HR 96 | Temp 98.0°F | Ht 63.0 in | Wt 208.0 lb

## 2017-04-14 DIAGNOSIS — F39 Unspecified mood [affective] disorder: Secondary | ICD-10-CM | POA: Diagnosis not present

## 2017-04-14 DIAGNOSIS — Z0000 Encounter for general adult medical examination without abnormal findings: Secondary | ICD-10-CM

## 2017-04-14 DIAGNOSIS — I10 Essential (primary) hypertension: Secondary | ICD-10-CM

## 2017-04-14 DIAGNOSIS — Z7189 Other specified counseling: Secondary | ICD-10-CM

## 2017-04-14 DIAGNOSIS — E1140 Type 2 diabetes mellitus with diabetic neuropathy, unspecified: Secondary | ICD-10-CM | POA: Diagnosis not present

## 2017-04-14 DIAGNOSIS — Z23 Encounter for immunization: Secondary | ICD-10-CM

## 2017-04-14 LAB — HM DIABETES FOOT EXAM

## 2017-04-14 NOTE — Assessment & Plan Note (Signed)
I have personally reviewed the Medicare Annual Wellness questionnaire and have noted 1. The patient's medical and social history 2. Their use of alcohol, tobacco or illicit drugs 3. Their current medications and supplements 4. The patient's functional ability including ADL's, fall risks, home safety risks and hearing or visual             impairment. 5. Diet and physical activities 6. Evidence for depression or mood disorders  The patients weight, height, BMI and visual acuity have been recorded in the chart I have made referrals, counseling and provided education to the patient based review of the above and I have provided the pt with a written personalized care plan for preventive services.  I have provided you with a copy of your personalized plan for preventive services. Please take the time to review along with your updated medication list.  Will update pneumovax No more cancer screenings due to age Yearly flu vaccine

## 2017-04-14 NOTE — Assessment & Plan Note (Signed)
Still seems to have good control No major pain with legs

## 2017-04-14 NOTE — Addendum Note (Signed)
Addended by: Pilar Grammes on: 04/14/2017 05:08 PM   Modules accepted: Orders

## 2017-04-14 NOTE — Assessment & Plan Note (Signed)
Has DNR 

## 2017-04-14 NOTE — Assessment & Plan Note (Signed)
BP Readings from Last 3 Encounters:  04/14/17 118/70  02/06/17 126/78  09/27/16 138/82   Good control

## 2017-04-14 NOTE — Assessment & Plan Note (Signed)
Chronic dysthymia and anxiety Just tranxene prn
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
## 2017-04-15 LAB — HEMOGLOBIN A1C: Hgb A1c MFr Bld: 7.4 % — ABNORMAL HIGH (ref 4.6–6.5)

## 2017-04-21 ENCOUNTER — Other Ambulatory Visit: Payer: Self-pay | Admitting: Internal Medicine

## 2017-04-22 NOTE — Telephone Encounter (Signed)
Left refill on voice mail at pharmacy  

## 2017-04-22 NOTE — Telephone Encounter (Signed)
Approved: #[phone] x 0 

## 2017-04-22 NOTE — Telephone Encounter (Signed)
Last filled 03-24-17 #90 Last OV/CPE 04-14-17 No Future OV

## 2017-05-09 ENCOUNTER — Other Ambulatory Visit: Payer: Self-pay | Admitting: Internal Medicine

## 2017-05-21 ENCOUNTER — Other Ambulatory Visit: Payer: Self-pay | Admitting: Internal Medicine

## 2017-05-21 NOTE — Telephone Encounter (Signed)
Left refill on voice mail at pharmacy  

## 2017-05-21 NOTE — Telephone Encounter (Signed)
Last filled 04-22-17 #90 Last OV 04-14-17 No Future OV

## 2017-05-21 NOTE — Telephone Encounter (Signed)
Approved: #[phone] x 0 

## 2017-06-01 ENCOUNTER — Other Ambulatory Visit: Payer: Self-pay | Admitting: Internal Medicine

## 2017-06-06 ENCOUNTER — Other Ambulatory Visit: Payer: Self-pay | Admitting: Internal Medicine

## 2017-06-16 DIAGNOSIS — E119 Type 2 diabetes mellitus without complications: Secondary | ICD-10-CM | POA: Diagnosis not present

## 2017-06-16 LAB — HM DIABETES EYE EXAM

## 2017-06-19 ENCOUNTER — Other Ambulatory Visit: Payer: Self-pay | Admitting: Internal Medicine

## 2017-06-19 NOTE — Telephone Encounter (Signed)
Approved: #[phone] x 0 

## 2017-06-19 NOTE — Telephone Encounter (Signed)
Last filled 05-21-17 #90 Last OV 04-14-17 No Future OV

## 2017-06-19 NOTE — Telephone Encounter (Signed)
Left refill on voice mail at pharmacy  

## 2017-06-20 IMAGING — MG MM SCREENING BREAST TOMO BILATERAL
8 of 12 series · 8 of 28 positions shown · non-contrast
Comparison: Previous exam(s).

ACR Breast Density Category a: The breast tissue is almost entirely
fatty.

CLINICAL DATA: Screening.

EXAM:
DIGITAL SCREENING BILATERAL MAMMOGRAM WITH 3D TOMO WITH CAD

[R MLO synth-2D]
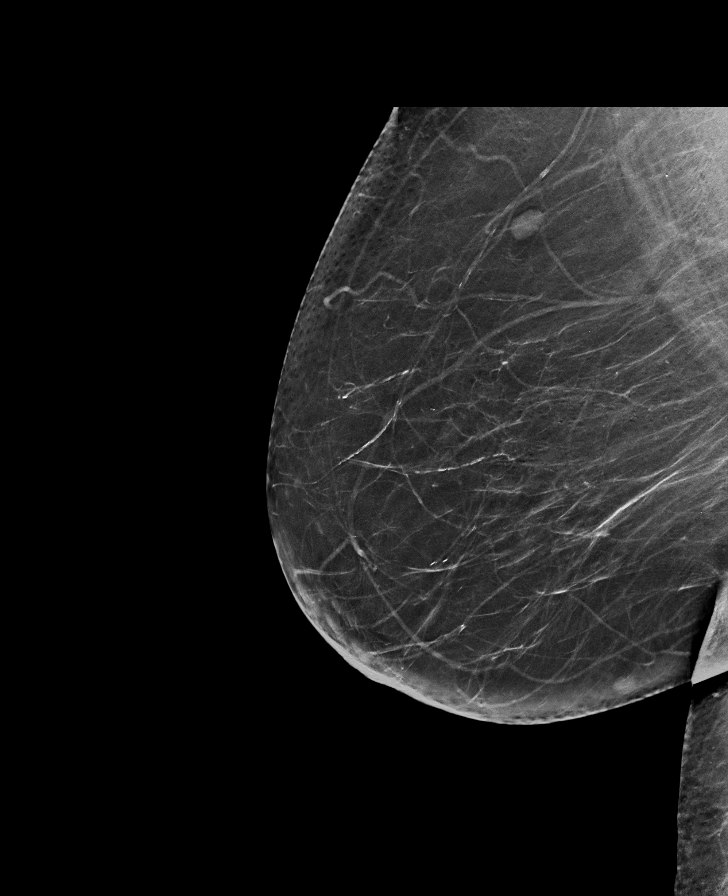

[L CC synth-2D]
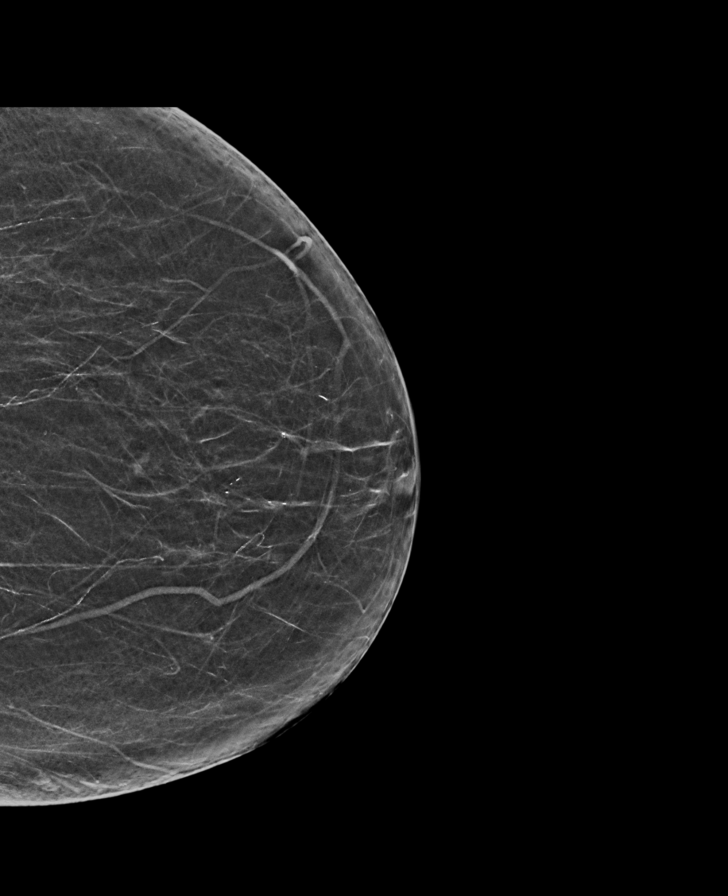

[R MLO]
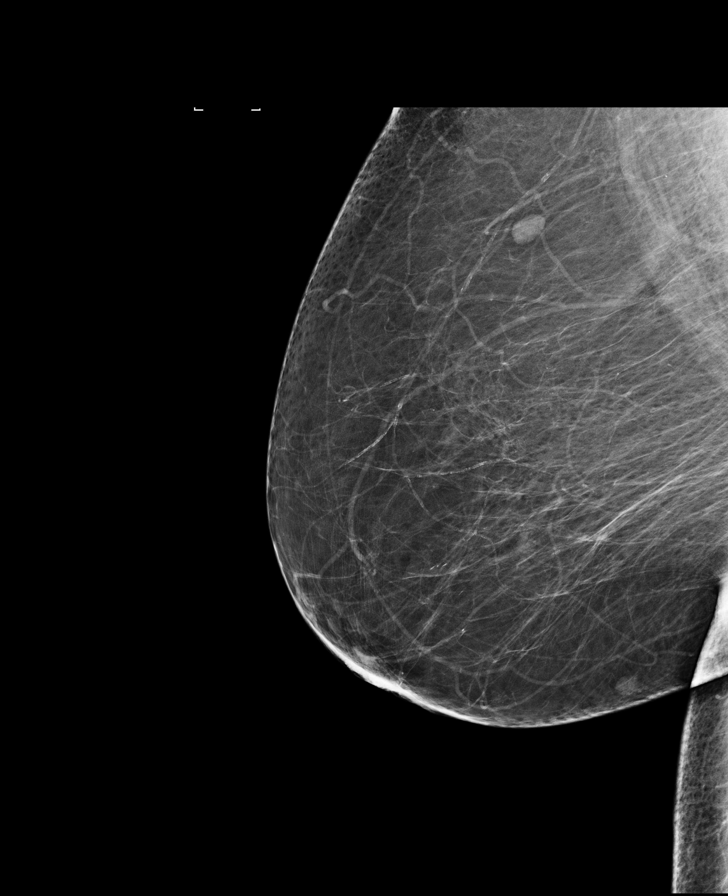

[L MLO synth-2D]
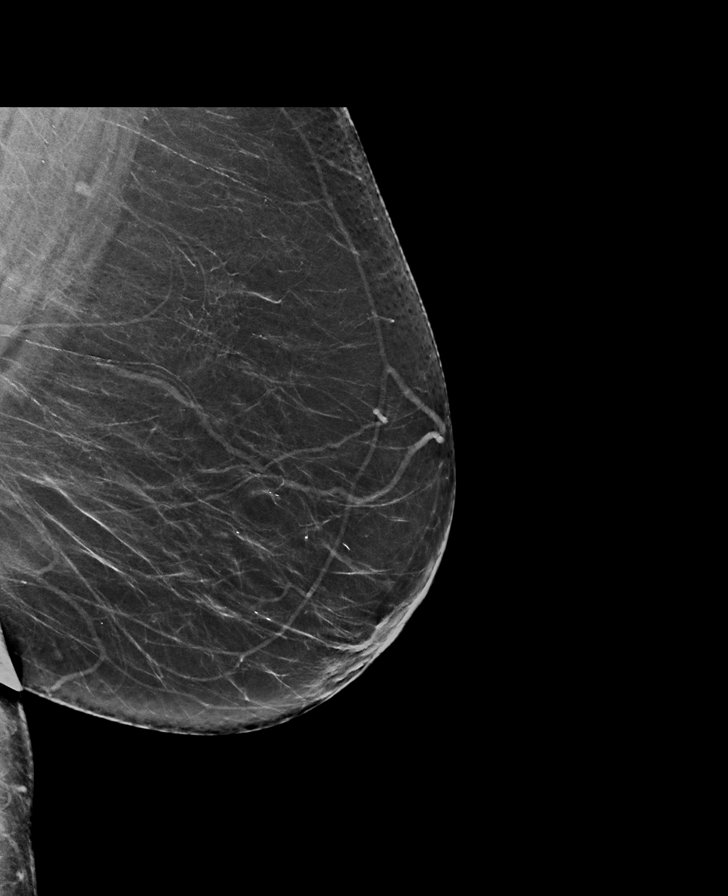

[L CC]
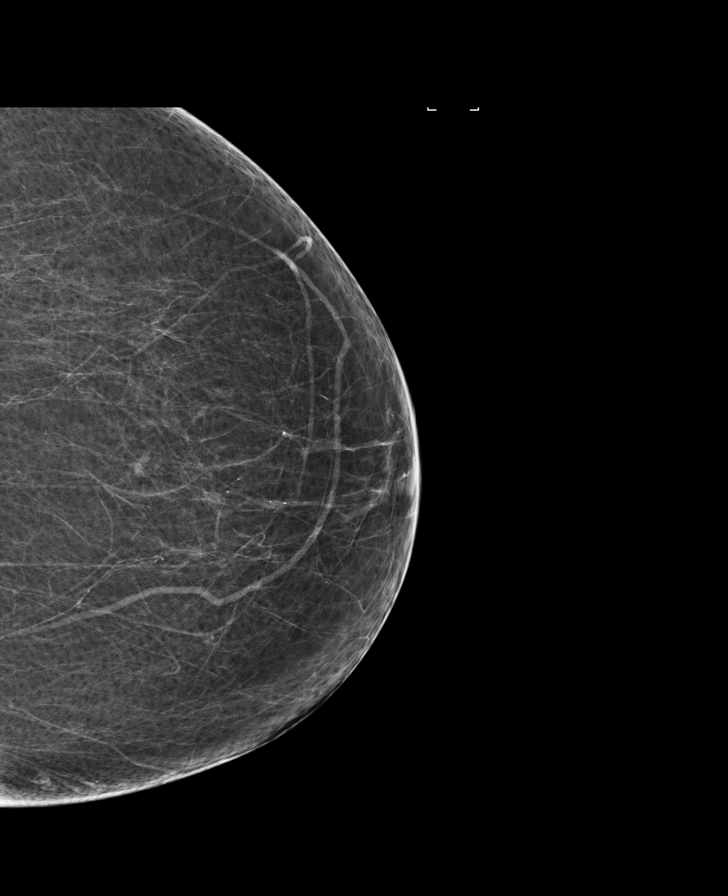

[L MLO]
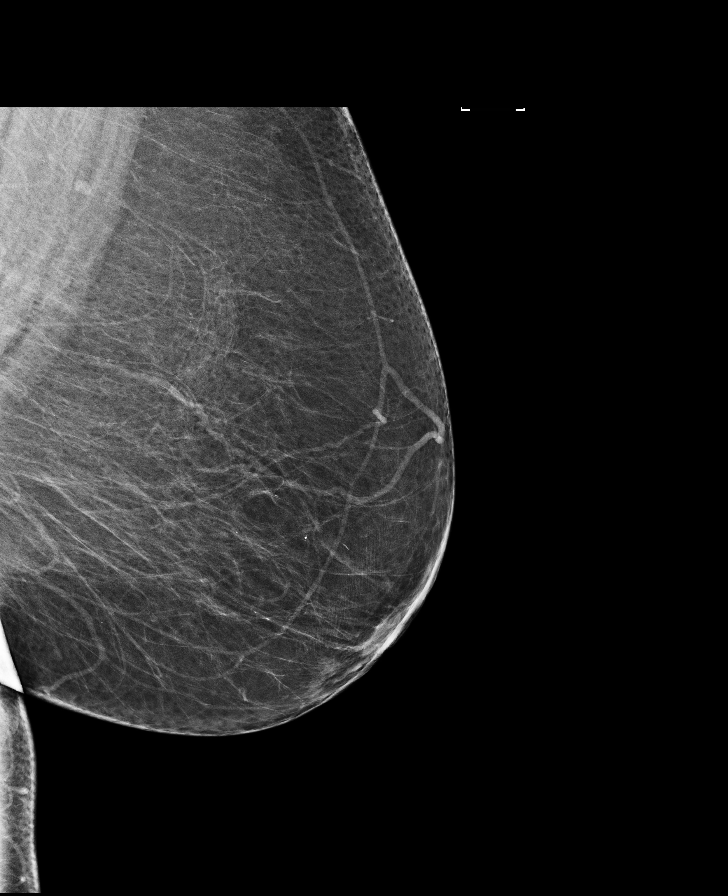

[R CC synth-2D]
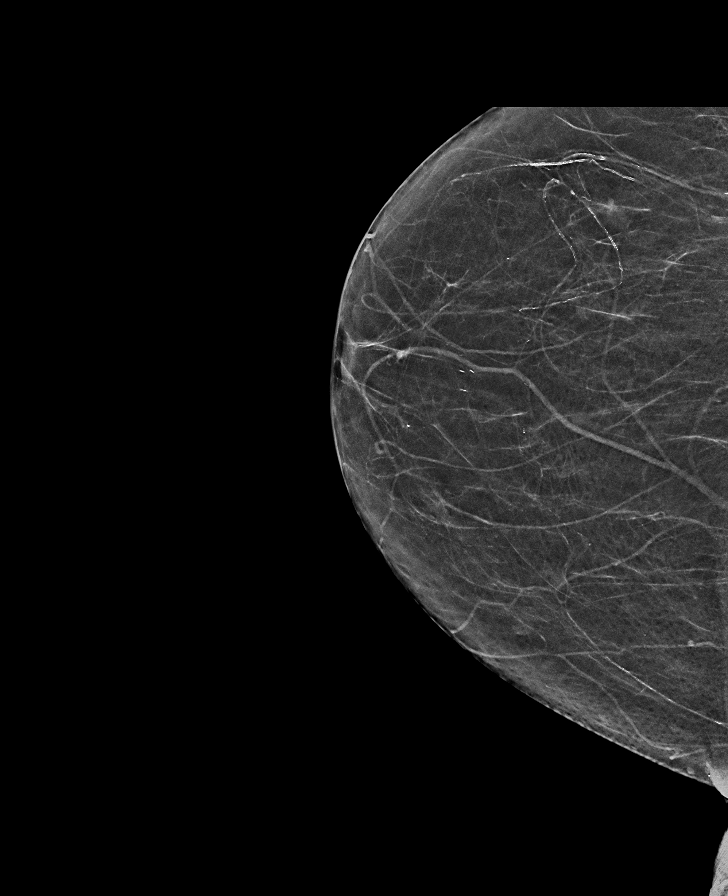

[R CC]
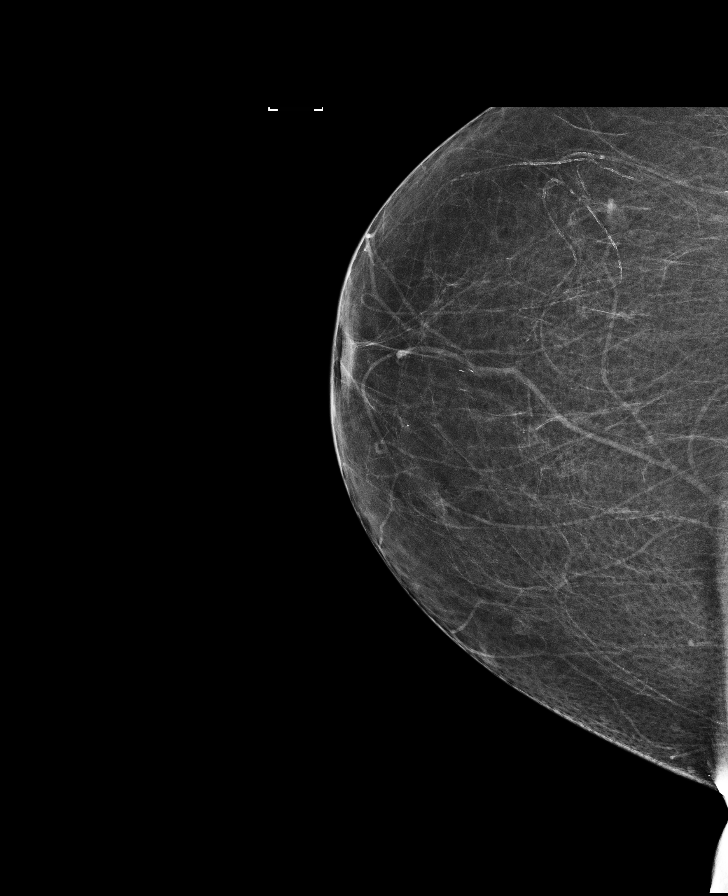

[8 of 28 positions shown; findings below may reference images not displayed]

FINDINGS: There are no findings suspicious for malignancy. Images were
processed with CAD.
IMPRESSION: No mammographic evidence of malignancy. A result letter of this
screening mammogram will be mailed directly to the patient.

RECOMMENDATION:
Screening mammogram in one year. (Code:[id number]-[id number])

BI-RADS CATEGORY  1: Negative.

## 2017-07-17 ENCOUNTER — Encounter: Payer: Self-pay | Admitting: Internal Medicine

## 2017-07-18 ENCOUNTER — Other Ambulatory Visit: Payer: Self-pay | Admitting: Internal Medicine

## 2017-07-18 NOTE — Telephone Encounter (Signed)
Left refill on voice mail at pharmacy  

## 2017-07-18 NOTE — Telephone Encounter (Signed)
Approved: #[phone] x 0 

## 2017-07-18 NOTE — Telephone Encounter (Signed)
Last filled 06-19-17 #90 Last OV 04-14-17 No Future OV

## 2017-07-22 DIAGNOSIS — Z23 Encounter for immunization: Secondary | ICD-10-CM | POA: Diagnosis not present

## 2017-08-15 ENCOUNTER — Other Ambulatory Visit: Payer: Self-pay | Admitting: Internal Medicine

## 2017-08-15 NOTE — Telephone Encounter (Signed)
Approved: #[phone] x 0 

## 2017-08-15 NOTE — Telephone Encounter (Signed)
Left refill on voice mail at pharmacy  

## 2017-08-15 NOTE — Telephone Encounter (Signed)
Last filled 07-18-17 #90 Last OV 04-14-17 No Future OV

## 2017-09-03 ENCOUNTER — Telehealth: Payer: Self-pay | Admitting: Internal Medicine

## 2017-09-03 NOTE — Telephone Encounter (Signed)
Thank you!  Chart updated

## 2017-09-03 NOTE — Telephone Encounter (Signed)
Copied from [place] [id number]. Topic: Quick Communication - See Telephone Encounter >> Sep 03, 2017  2:33 PM Robina Ade, Helene Kelp D wrote: CRM for notification. See Telephone encounter for: 09/03/17. Patient wanted to let the provider know that [facility] [place]

## 2017-09-14 ENCOUNTER — Other Ambulatory Visit: Payer: Self-pay | Admitting: Internal Medicine

## 2017-09-20 ENCOUNTER — Other Ambulatory Visit: Payer: Self-pay | Admitting: Internal Medicine

## 2017-09-22 NOTE — Telephone Encounter (Signed)
Last filled 04-22-17 #90 Last OV 04-14-17 Next OV 10-01-17

## 2017-09-22 NOTE — Telephone Encounter (Signed)
Left refill on voice mail at pharmacy  

## 2017-09-22 NOTE — Telephone Encounter (Signed)
Approved: #[phone] x 0 

## 2017-10-01 ENCOUNTER — Encounter: Payer: Self-pay | Admitting: Internal Medicine

## 2017-10-01 ENCOUNTER — Ambulatory Visit (INDEPENDENT_AMBULATORY_CARE_PROVIDER_SITE_OTHER): Payer: Medicare Other | Admitting: Internal Medicine

## 2017-10-01 VITALS — BP 136/86 | HR 97 | Temp 97.6°F | Wt 213.5 lb

## 2017-10-01 DIAGNOSIS — I10 Essential (primary) hypertension: Secondary | ICD-10-CM | POA: Diagnosis not present

## 2017-10-01 DIAGNOSIS — S39012A Strain of muscle, fascia and tendon of lower back, initial encounter: Secondary | ICD-10-CM | POA: Diagnosis not present

## 2017-10-01 DIAGNOSIS — F39 Unspecified mood [affective] disorder: Secondary | ICD-10-CM | POA: Diagnosis not present

## 2017-10-01 DIAGNOSIS — E1140 Type 2 diabetes mellitus with diabetic neuropathy, unspecified: Secondary | ICD-10-CM | POA: Diagnosis not present

## 2017-10-01 LAB — COMPREHENSIVE METABOLIC PANEL
ALT: 15 U/L (ref 0–35)
AST: 24 U/L (ref 0–37)
Albumin: 4.4 g/dL (ref 3.5–5.2)
Alkaline Phosphatase: 85 U/L (ref 39–117)
BUN: 17 mg/dL (ref 6–23)
CHLORIDE: 99 meq/L (ref 96–112)
CO2: 28 mEq/L (ref 19–32)
Calcium: 9.5 mg/dL (ref 8.4–10.5)
Creatinine, Ser: 0.91 mg/dL (ref 0.40–1.20)
GFR: 63.37 mL/min (ref >60.00)
GLUCOSE: 103 mg/dL — AB (ref 70–99)
POTASSIUM: 4.1 meq/L (ref 3.5–5.1)
SODIUM: 137 meq/L (ref 135–145)
Total Bilirubin: 0.3 mg/dL (ref 0.2–1.2)
Total Protein: 7.4 g/dL (ref 6.0–8.3)

## 2017-10-01 LAB — LIPID PANEL
CHOLESTEROL: 204 mg/dL — AB (ref 0–200)
HDL: 63.8 mg/dL (ref >39.00)
LDL Cholesterol: 109 mg/dL — ABNORMAL HIGH (ref 0–99)
NONHDL: 140.42
Total CHOL/HDL Ratio: 3
Triglycerides: 157 mg/dL — ABNORMAL HIGH (ref 0.0–149.0)
VLDL: 31.4 mg/dL (ref 0.0–40.0)

## 2017-10-01 LAB — CBC
HCT: 42.6 % (ref 36.0–46.0)
HEMOGLOBIN: 13.8 g/dL (ref 12.0–15.0)
MCHC: 32.3 g/dL (ref 30.0–36.0)
MCV: 88.1 fl (ref 78.0–100.0)
PLATELETS: 334 10*3/uL (ref 150.0–400.0)
RBC: 4.84 Mil/uL (ref 3.87–5.11)
RDW: 14.9 % (ref 11.5–15.5)
WBC: 9.2 10*3/uL (ref 4.0–10.5)

## 2017-10-01 LAB — MICROALBUMIN / CREATININE URINE RATIO
Creatinine,U: 105.5 mg/dL
MICROALB/CREAT RATIO: 0.7 mg/g (ref 0.0–30.0)
Microalb, Ur: 0.7 mg/dL (ref 0.0–1.9)

## 2017-10-01 LAB — HEMOGLOBIN A1C: HEMOGLOBIN A1C: 7.3 % — AB (ref 4.6–6.5)

## 2017-10-01 MED ORDER — TIZANIDINE HCL 2 MG PO TABS: 2.0000 mg | ORAL_TABLET | Freq: Every evening | ORAL | 0 refills | Status: DC | PRN

## 2017-10-01 NOTE — Assessment & Plan Note (Signed)
Ongoing distress with son dying of cancer [place]

## 2017-10-01 NOTE — Assessment & Plan Note (Signed)
Seems to still have acceptable control Mild neuropathy symptoms Will check labs

## 2017-10-01 NOTE — Patient Instructions (Signed)
You can try over the counter lidocaine patches on your back. You could take a rare aleve or advil for your back as well.

## 2017-10-01 NOTE — Assessment & Plan Note (Signed)
Seems to just have strain Nothing to suggest HNP Will try muscle relaxer, lidoderm, ice

## 2017-10-01 NOTE — Assessment & Plan Note (Signed)
BP Readings from Last 3 Encounters:  10/01/17 136/86  04/14/17 118/70  02/06/17 126/78   Good control No changes needed
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
## 2017-10-19 ENCOUNTER — Other Ambulatory Visit: Payer: Self-pay | Admitting: Internal Medicine

## 2017-10-19 DIAGNOSIS — R0989 Other specified symptoms and signs involving the circulatory and respiratory systems: Secondary | ICD-10-CM | POA: Diagnosis not present

## 2017-10-19 DIAGNOSIS — J0110 Acute frontal sinusitis, unspecified: Secondary | ICD-10-CM | POA: Diagnosis not present

## 2017-10-19 DIAGNOSIS — R05 Cough: Secondary | ICD-10-CM | POA: Diagnosis not present

## 2017-10-19 DIAGNOSIS — J208 Acute bronchitis due to other specified organisms: Secondary | ICD-10-CM | POA: Diagnosis not present

## 2017-10-19 DIAGNOSIS — J3489 Other specified disorders of nose and nasal sinuses: Secondary | ICD-10-CM | POA: Diagnosis not present

## 2017-10-19 DIAGNOSIS — B9689 Other specified bacterial agents as the cause of diseases classified elsewhere: Secondary | ICD-10-CM | POA: Diagnosis not present

## 2017-10-20 NOTE — Telephone Encounter (Signed)
Last filled 09-22-17 #30 Last OV 10-01-17 No Future OV

## 2017-10-20 NOTE — Telephone Encounter (Signed)
Px written for call in  shann

## 2017-10-20 NOTE — Telephone Encounter (Signed)
Last filled 09-22-17 #90 Last OV 10-01-17 No Future OV  Forwarding to Dr Glori Bickers in Dr Alla German absence. Please send back to [facility]. Thanks

## 2017-10-22 NOTE — Telephone Encounter (Signed)
Rx called in to requested pharmacy 

## 2017-10-28 ENCOUNTER — Other Ambulatory Visit: Payer: Self-pay | Admitting: Internal Medicine

## 2017-11-04 ENCOUNTER — Other Ambulatory Visit: Payer: Self-pay | Admitting: Internal Medicine

## 2017-11-25 ENCOUNTER — Other Ambulatory Visit: Payer: Self-pay | Admitting: Internal Medicine

## 2017-11-25 MED ORDER — CLORAZEPATE DIPOTASSIUM 7.5 MG PO TABS: 7.5000 mg | ORAL_TABLET | Freq: Three times a day (TID) | ORAL | 0 refills | Status: DC | PRN

## 2017-11-25 NOTE — Telephone Encounter (Signed)
Last Rx 10/20/2017 #90. Last OV 09/2017

## 2017-11-25 NOTE — Telephone Encounter (Signed)
Rx refill for provider review:  Clorazepate 7.5 mg  LOV: 10/01/17  Last filled 10/20/17  Pharmacy: [facility]

## 2017-11-25 NOTE — Telephone Encounter (Signed)
Copied from [place] [id number]. Topic: Quick Communication - Rx Refill/Question >> Nov 25, 2017  9:17 AM Robina Ade, Helene Kelp D wrote: Medication: clorazepate (TRANXENE) 7.5 MG tablet Has the patient contacted their pharmacy? Yes (Agent: If no, request that the patient contact the pharmacy for the refill.) Preferred Pharmacy (with phone number or street name):[id number] [place], [place] - [address]: Please be advised that RX refills may take up to 3 business days. We ask that you follow-up with your pharmacy.

## 2017-12-04 ENCOUNTER — Other Ambulatory Visit: Payer: Self-pay | Admitting: Internal Medicine

## 2017-12-09 ENCOUNTER — Telehealth: Payer: Self-pay | Admitting: Internal Medicine

## 2017-12-09 MED ORDER — GLUCOSE BLOOD VI STRP: ORAL_STRIP | 3 refills | Status: AC

## 2017-12-09 NOTE — Telephone Encounter (Signed)
Copied from [place] [id number]. Topic: Quick Communication - Rx Refill/Question >> Dec 09, 2017  8:34 AM Boyd Kerbs wrote:  Medication:  glucose blood (TRUE METRIX BLOOD GLUCOSE TEST) test strip   Has the patient contacted their pharmacy? Yes.    [facility] and if she wants her medicine she would need to call dr. Gabriel Carina  (Agent: If no, request that the patient contact the pharmacy for the refill.)   Preferred Pharmacy (with phone number or street name): [id number] [place], [place] - [address] [place] [address] Phone: [phone] Fax: [phone]   Agent: Please be advised that RX refills may take up to 3 business days. We ask that you follow-up with your pharmacy.

## 2017-12-21 ENCOUNTER — Other Ambulatory Visit: Payer: Self-pay | Admitting: Internal Medicine

## 2017-12-22 NOTE — Telephone Encounter (Signed)
Last filled 11-25-17 #90 Last OV/CPE 10-01-17 No Future OV  Forward to Dr Glori Bickers in Dr Alla German absence

## 2017-12-22 NOTE — Telephone Encounter (Signed)
Refilled once in pcp absence 

## 2018-01-01 ENCOUNTER — Other Ambulatory Visit: Payer: Self-pay | Admitting: Internal Medicine

## 2018-01-19 ENCOUNTER — Other Ambulatory Visit: Payer: Self-pay | Admitting: Internal Medicine

## 2018-01-19 NOTE — Telephone Encounter (Unsigned)
Copied from [place] [id number]. Topic: Quick Communication - Rx Refill/Question >> Jan 19, 2018  3:01 PM Carolyn Stare wrote: Medication clorazepate (TRANXENE) 7.5 MG tablet  Has the patient contacted their pharmacy yes    [facility]     Agent: Please be advised that RX refills may take up to 3 business days. We ask that you follow-up with your pharmacy.

## 2018-01-20 MED ORDER — CLORAZEPATE DIPOTASSIUM 7.5 MG PO TABS: 7.5000 mg | ORAL_TABLET | Freq: Three times a day (TID) | ORAL | 0 refills | Status: AC | PRN

## 2018-01-20 NOTE — Telephone Encounter (Signed)
Rx refill request: Clorazepate 7.5 mg   LOV: 10/01/17   PCP: [place]: verified

## 2018-01-22 ENCOUNTER — Telehealth: Payer: Self-pay | Admitting: Internal Medicine

## 2018-01-22 NOTE — Telephone Encounter (Signed)
Patient calling back again to explain that the [facility] [address] [address] where it was sent, does not have the script available at their pharmacy, as in, they don't have it in stock.   They advised the patient to have Dr. Silvio Pate send it to  [id number] [place] [place]  [address] [place] [address]  Phone: [phone] Fax: [phone]   Patient is going out of town and is supposed to leave at 2pm today to do so. Please call her to confirm when [facility]. If not able to send soon, please call patient to see about sending to a pharmacy where she will be traveling.

## 2018-01-22 NOTE — Telephone Encounter (Signed)
Done 4/2 Please check into why it is coming over again

## 2018-01-22 NOTE — Telephone Encounter (Signed)
[facility] at the [facility] on [address] ,thanks

## 2018-01-22 NOTE — Telephone Encounter (Signed)
Pt called and stated she had to get on the road and could not wait for the 1.5 hours for the rx to be ready at [place]. She would like this rx sent to  [id number] [place], [place] [phone] (Phone) [phone] (Fax)    CB#: [phone]

## 2018-01-22 NOTE — Telephone Encounter (Signed)
I called in the rx to [facility] and asked they delete the rx we sent to them.

## 2018-01-22 NOTE — Telephone Encounter (Signed)
Approved: Please call the pharmacy to straighten this out [facility] should have been able to transfer the Rx

## 2018-01-22 NOTE — Telephone Encounter (Signed)
Copied from [place]. Topic: Quick Communication - Rx Refill/Question >> Jan 22, 2018  9:37 AM Carolyn Stare wrote: [facility] not [facility] below med but the office located on [facility] [address] does have it and we need the RX sent there . Pt is leaving town today.     Medication    clorazepate (TRANXENE) 7.5 MG tablet  Has the patient contacted their pharmacy yes    Preferred [facility] [address] [facility]    Agent: Please be advised that RX refills may take up to 3 business days. We ask that you follow-up with your pharmacy.

## 2018-01-22 NOTE — Telephone Encounter (Signed)
LOV  10/01/17 Dr. Silvio Pate See request

## 2018-01-23 NOTE — Telephone Encounter (Signed)
Called in rx to [facility] [place]

## 2018-02-03 ENCOUNTER — Other Ambulatory Visit: Payer: Self-pay | Admitting: Internal Medicine

## 2018-02-03 NOTE — Telephone Encounter (Signed)
Approved: #30 x 1 

## 2018-02-12 DIAGNOSIS — E669 Obesity, unspecified: Secondary | ICD-10-CM | POA: Diagnosis not present

## 2018-02-12 DIAGNOSIS — L209 Atopic dermatitis, unspecified: Secondary | ICD-10-CM | POA: Diagnosis not present

## 2018-02-12 DIAGNOSIS — E1165 Type 2 diabetes mellitus with hyperglycemia: Secondary | ICD-10-CM | POA: Diagnosis not present

## 2018-02-12 DIAGNOSIS — Z6838 Body mass index (BMI) 38.0-38.9, adult: Secondary | ICD-10-CM | POA: Diagnosis not present

## 2018-02-12 DIAGNOSIS — Z0000 Encounter for general adult medical examination without abnormal findings: Secondary | ICD-10-CM | POA: Diagnosis not present

## 2018-02-24 DIAGNOSIS — I10 Essential (primary) hypertension: Secondary | ICD-10-CM | POA: Diagnosis not present

## 2018-02-24 DIAGNOSIS — I6522 Occlusion and stenosis of left carotid artery: Secondary | ICD-10-CM | POA: Diagnosis not present

## 2018-02-24 DIAGNOSIS — G9389 Other specified disorders of brain: Secondary | ICD-10-CM | POA: Diagnosis not present

## 2018-02-24 DIAGNOSIS — Z113 Encounter for screening for infections with a predominantly sexual mode of transmission: Secondary | ICD-10-CM | POA: Diagnosis not present

## 2018-02-24 DIAGNOSIS — I6523 Occlusion and stenosis of bilateral carotid arteries: Secondary | ICD-10-CM | POA: Diagnosis not present

## 2018-02-24 DIAGNOSIS — E669 Obesity, unspecified: Secondary | ICD-10-CM | POA: Diagnosis not present

## 2018-02-24 DIAGNOSIS — I639 Cerebral infarction, unspecified: Secondary | ICD-10-CM | POA: Diagnosis not present

## 2018-02-24 DIAGNOSIS — R42 Dizziness and giddiness: Secondary | ICD-10-CM | POA: Diagnosis not present

## 2018-02-24 DIAGNOSIS — Z6839 Body mass index (BMI) 39.0-39.9, adult: Secondary | ICD-10-CM | POA: Diagnosis not present

## 2018-02-24 DIAGNOSIS — K219 Gastro-esophageal reflux disease without esophagitis: Secondary | ICD-10-CM | POA: Diagnosis not present

## 2018-02-24 DIAGNOSIS — L209 Atopic dermatitis, unspecified: Secondary | ICD-10-CM | POA: Diagnosis not present

## 2018-02-24 DIAGNOSIS — Z87891 Personal history of nicotine dependence: Secondary | ICD-10-CM | POA: Diagnosis not present

## 2018-02-24 DIAGNOSIS — R51 Headache: Secondary | ICD-10-CM | POA: Diagnosis not present

## 2018-02-24 DIAGNOSIS — Z7982 Long term (current) use of aspirin: Secondary | ICD-10-CM | POA: Diagnosis not present

## 2018-02-24 DIAGNOSIS — E785 Hyperlipidemia, unspecified: Secondary | ICD-10-CM | POA: Diagnosis not present

## 2018-02-24 DIAGNOSIS — Z79899 Other long term (current) drug therapy: Secondary | ICD-10-CM | POA: Diagnosis not present

## 2018-02-24 DIAGNOSIS — R252 Cramp and spasm: Secondary | ICD-10-CM | POA: Diagnosis not present

## 2018-02-24 DIAGNOSIS — E7800 Pure hypercholesterolemia, unspecified: Secondary | ICD-10-CM | POA: Diagnosis not present

## 2018-02-24 DIAGNOSIS — I69398 Other sequelae of cerebral infarction: Secondary | ICD-10-CM | POA: Diagnosis not present

## 2018-02-24 DIAGNOSIS — Z888 Allergy status to other drugs, medicaments and biological substances status: Secondary | ICD-10-CM | POA: Diagnosis not present

## 2018-02-24 DIAGNOSIS — R5383 Other fatigue: Secondary | ICD-10-CM | POA: Diagnosis not present

## 2018-02-24 DIAGNOSIS — J309 Allergic rhinitis, unspecified: Secondary | ICD-10-CM | POA: Diagnosis not present

## 2018-02-24 DIAGNOSIS — R531 Weakness: Secondary | ICD-10-CM | POA: Diagnosis not present

## 2018-02-24 DIAGNOSIS — F411 Generalized anxiety disorder: Secondary | ICD-10-CM | POA: Diagnosis not present

## 2018-02-24 DIAGNOSIS — E039 Hypothyroidism, unspecified: Secondary | ICD-10-CM | POA: Diagnosis not present

## 2018-02-24 DIAGNOSIS — R4182 Altered mental status, unspecified: Secondary | ICD-10-CM | POA: Diagnosis not present

## 2018-02-24 DIAGNOSIS — E1165 Type 2 diabetes mellitus with hyperglycemia: Secondary | ICD-10-CM | POA: Diagnosis not present

## 2018-02-24 DIAGNOSIS — G459 Transient cerebral ischemic attack, unspecified: Secondary | ICD-10-CM | POA: Diagnosis not present

## 2018-02-24 DIAGNOSIS — E119 Type 2 diabetes mellitus without complications: Secondary | ICD-10-CM | POA: Diagnosis not present

## 2018-02-26 DIAGNOSIS — G459 Transient cerebral ischemic attack, unspecified: Secondary | ICD-10-CM | POA: Diagnosis not present

## 2018-03-02 DIAGNOSIS — Z01818 Encounter for other preprocedural examination: Secondary | ICD-10-CM | POA: Diagnosis not present

## 2018-03-02 DIAGNOSIS — E039 Hypothyroidism, unspecified: Secondary | ICD-10-CM | POA: Diagnosis not present

## 2018-03-02 DIAGNOSIS — L209 Atopic dermatitis, unspecified: Secondary | ICD-10-CM | POA: Diagnosis not present

## 2018-03-02 DIAGNOSIS — Q211 Atrial septal defect: Secondary | ICD-10-CM | POA: Diagnosis not present

## 2018-03-02 DIAGNOSIS — E1165 Type 2 diabetes mellitus with hyperglycemia: Secondary | ICD-10-CM | POA: Diagnosis not present

## 2018-03-02 DIAGNOSIS — I6521 Occlusion and stenosis of right carotid artery: Secondary | ICD-10-CM | POA: Diagnosis not present

## 2018-03-02 DIAGNOSIS — F411 Generalized anxiety disorder: Secondary | ICD-10-CM | POA: Diagnosis not present

## 2018-03-02 DIAGNOSIS — Z6839 Body mass index (BMI) 39.0-39.9, adult: Secondary | ICD-10-CM | POA: Diagnosis not present

## 2018-03-02 DIAGNOSIS — I10 Essential (primary) hypertension: Secondary | ICD-10-CM | POA: Diagnosis not present

## 2018-03-10 DIAGNOSIS — Z713 Dietary counseling and surveillance: Secondary | ICD-10-CM | POA: Diagnosis not present

## 2018-03-10 DIAGNOSIS — E1165 Type 2 diabetes mellitus with hyperglycemia: Secondary | ICD-10-CM | POA: Diagnosis not present

## 2018-03-10 DIAGNOSIS — L209 Atopic dermatitis, unspecified: Secondary | ICD-10-CM | POA: Diagnosis not present

## 2018-03-10 DIAGNOSIS — Z6839 Body mass index (BMI) 39.0-39.9, adult: Secondary | ICD-10-CM | POA: Diagnosis not present

## 2018-03-10 DIAGNOSIS — M2550 Pain in unspecified joint: Secondary | ICD-10-CM | POA: Diagnosis not present

## 2018-03-10 DIAGNOSIS — Z719 Counseling, unspecified: Secondary | ICD-10-CM | POA: Diagnosis not present

## 2018-03-10 DIAGNOSIS — L299 Pruritus, unspecified: Secondary | ICD-10-CM | POA: Diagnosis not present

## 2018-03-10 DIAGNOSIS — F411 Generalized anxiety disorder: Secondary | ICD-10-CM | POA: Diagnosis not present

## 2018-03-10 DIAGNOSIS — Z7182 Exercise counseling: Secondary | ICD-10-CM | POA: Diagnosis not present

## 2018-03-12 DIAGNOSIS — I6521 Occlusion and stenosis of right carotid artery: Secondary | ICD-10-CM | POA: Diagnosis not present

## 2018-03-13 DIAGNOSIS — I6521 Occlusion and stenosis of right carotid artery: Secondary | ICD-10-CM | POA: Diagnosis not present

## 2018-03-17 DIAGNOSIS — I10 Essential (primary) hypertension: Secondary | ICD-10-CM | POA: Diagnosis not present

## 2018-03-17 DIAGNOSIS — E1165 Type 2 diabetes mellitus with hyperglycemia: Secondary | ICD-10-CM | POA: Diagnosis not present

## 2018-03-17 DIAGNOSIS — N390 Urinary tract infection, site not specified: Secondary | ICD-10-CM | POA: Diagnosis not present

## 2018-03-17 DIAGNOSIS — Z6838 Body mass index (BMI) 38.0-38.9, adult: Secondary | ICD-10-CM | POA: Diagnosis not present

## 2018-03-17 DIAGNOSIS — Z0000 Encounter for general adult medical examination without abnormal findings: Secondary | ICD-10-CM | POA: Diagnosis not present

## 2018-03-17 DIAGNOSIS — M2550 Pain in unspecified joint: Secondary | ICD-10-CM | POA: Diagnosis not present

## 2018-03-17 DIAGNOSIS — E785 Hyperlipidemia, unspecified: Secondary | ICD-10-CM | POA: Diagnosis not present

## 2018-03-17 DIAGNOSIS — E039 Hypothyroidism, unspecified: Secondary | ICD-10-CM | POA: Diagnosis not present

## 2018-03-17 DIAGNOSIS — L299 Pruritus, unspecified: Secondary | ICD-10-CM | POA: Diagnosis not present

## 2018-03-17 DIAGNOSIS — J309 Allergic rhinitis, unspecified: Secondary | ICD-10-CM | POA: Diagnosis not present

## 2018-03-17 DIAGNOSIS — F411 Generalized anxiety disorder: Secondary | ICD-10-CM | POA: Diagnosis not present

## 2018-03-17 DIAGNOSIS — L209 Atopic dermatitis, unspecified: Secondary | ICD-10-CM | POA: Diagnosis not present

## 2018-03-19 DIAGNOSIS — E7800 Pure hypercholesterolemia, unspecified: Secondary | ICD-10-CM | POA: Diagnosis not present

## 2018-03-19 DIAGNOSIS — E785 Hyperlipidemia, unspecified: Secondary | ICD-10-CM | POA: Diagnosis not present

## 2018-03-19 DIAGNOSIS — I6521 Occlusion and stenosis of right carotid artery: Secondary | ICD-10-CM | POA: Diagnosis not present

## 2018-03-19 DIAGNOSIS — E119 Type 2 diabetes mellitus without complications: Secondary | ICD-10-CM | POA: Diagnosis not present

## 2018-03-19 DIAGNOSIS — Z8673 Personal history of transient ischemic attack (TIA), and cerebral infarction without residual deficits: Secondary | ICD-10-CM | POA: Diagnosis not present

## 2018-03-19 DIAGNOSIS — I10 Essential (primary) hypertension: Secondary | ICD-10-CM | POA: Diagnosis not present

## 2018-03-19 DIAGNOSIS — K219 Gastro-esophageal reflux disease without esophagitis: Secondary | ICD-10-CM | POA: Diagnosis not present

## 2018-03-30 ENCOUNTER — Other Ambulatory Visit: Payer: Self-pay | Admitting: Internal Medicine

## 2018-03-30 ENCOUNTER — Other Ambulatory Visit: Payer: Self-pay

## 2018-03-30 NOTE — Patient Outreach (Signed)
[facility]) Care Management  03/30/2018  Sheila Cunningham [birth year] [id number]    Referral received. No outreach warranted at this time. Transition of Care  will be completed by primary care provider office who will refer to [facility] care management if needed.  Plan: RN CM will close case.  [name], RN, MSN [facility] Care Management Care Management Coordinator Direct Line [phone] Toll Free: [phone]  Fax: [phone]

## 2018-03-30 NOTE — Telephone Encounter (Signed)
Last filled 02-28-18 #30 Last OV 10-01-17 No Future OV

## 2018-03-30 NOTE — Telephone Encounter (Signed)
Approved: #30 x 1 

## 2018-04-28 DIAGNOSIS — N390 Urinary tract infection, site not specified: Secondary | ICD-10-CM | POA: Diagnosis not present

## 2018-05-28 MED ORDER — HYDRALAZINE HCL 20 MG/ML IJ SOLN
10.00 | INTRAMUSCULAR | Status: DC
Start: ? — End: 2018-05-28

## 2018-05-28 MED ORDER — NITROGLYCERIN 0.4 MG SL SUBL
0.40 | SUBLINGUAL_TABLET | SUBLINGUAL | Status: DC
Start: ? — End: 2018-05-28

## 2018-05-28 MED ORDER — ENOXAPARIN SODIUM 40 MG/0.4ML ~~LOC~~ SOLN
40.00 | SUBCUTANEOUS | Status: DC
Start: 2018-05-28 — End: 2018-05-28

## 2018-05-28 MED ORDER — SENNA-DOCUSATE SODIUM 8.6-50 MG PO TABS
1.00 | ORAL_TABLET | ORAL | Status: DC
Start: ? — End: 2018-05-28

## 2018-05-28 MED ORDER — METFORMIN HCL 500 MG PO TABS
1000.00 | ORAL_TABLET | ORAL | Status: DC
Start: 2018-05-28 — End: 2018-05-28

## 2018-05-28 MED ORDER — VITAMIN C 500 MG PO TABS
1000.00 | ORAL_TABLET | ORAL | Status: DC
Start: 2018-05-29 — End: 2018-05-28

## 2018-05-28 MED ORDER — LEVOTHYROXINE SODIUM 50 MCG PO TABS
50.00 | ORAL_TABLET | ORAL | Status: DC
Start: 2018-05-29 — End: 2018-05-28

## 2018-05-28 MED ORDER — ESCITALOPRAM OXALATE 10 MG PO TABS
5.00 | ORAL_TABLET | ORAL | Status: DC
Start: 2018-05-29 — End: 2018-05-28

## 2018-05-28 MED ORDER — GENERIC EXTERNAL MEDICATION
4.00 | Status: DC
Start: ? — End: 2018-05-28

## 2018-05-28 MED ORDER — PANTOPRAZOLE SODIUM 20 MG PO TBEC
20.00 | DELAYED_RELEASE_TABLET | ORAL | Status: DC
Start: 2018-05-29 — End: 2018-05-28

## 2018-05-28 MED ORDER — BUSPIRONE HCL 5 MG PO TABS
7.50 | ORAL_TABLET | ORAL | Status: DC
Start: 2018-05-28 — End: 2018-05-28

## 2018-05-28 MED ORDER — POTASSIUM CHLORIDE CRYS ER 20 MEQ PO TBCR
20.00 | EXTENDED_RELEASE_TABLET | ORAL | Status: DC
Start: 2018-05-28 — End: 2018-05-28

## 2018-05-28 MED ORDER — ACETAMINOPHEN 325 MG PO TABS
650.00 | ORAL_TABLET | ORAL | Status: DC
Start: ? — End: 2018-05-28

## 2018-05-28 MED ORDER — CIPROFLOXACIN HCL 500 MG PO TABS
500.00 | ORAL_TABLET | ORAL | Status: DC
Start: 2018-05-28 — End: 2018-05-28

## 2018-05-28 MED ORDER — LOPERAMIDE HCL 2 MG PO CAPS
2.00 | ORAL_CAPSULE | ORAL | Status: DC
Start: ? — End: 2018-05-28

## 2018-05-28 MED ORDER — ALUM & MAG HYDROXIDE-SIMETH 200-200-20 MG/5ML PO SUSP
30.00 | ORAL | Status: DC
Start: ? — End: 2018-05-28

## 2018-05-28 MED ORDER — ASPIRIN EC 81 MG PO TBEC
81.00 | DELAYED_RELEASE_TABLET | ORAL | Status: DC
Start: 2018-05-29 — End: 2018-05-28

## 2018-05-28 MED ORDER — GENERIC EXTERNAL MEDICATION
10.00 | Status: DC
Start: ? — End: 2018-05-28

## 2018-05-28 MED ORDER — SODIUM CHLORIDE 0.9 % IV SOLN
10.00 | INTRAVENOUS | Status: DC
Start: ? — End: 2018-05-28

## 2018-05-28 MED ORDER — ACETAMINOPHEN 650 MG RE SUPP
650.00 | RECTAL | Status: DC
Start: ? — End: 2018-05-28

## 2018-05-28 MED ORDER — ALBUTEROL SULFATE (2.5 MG/3ML) 0.083% IN NEBU
2.50 | INHALATION_SOLUTION | RESPIRATORY_TRACT | Status: DC
Start: ? — End: 2018-05-28

## 2018-05-28 MED ORDER — ATORVASTATIN CALCIUM 40 MG PO TABS
40.00 | ORAL_TABLET | ORAL | Status: DC
Start: 2018-05-28 — End: 2018-05-28

## 2018-05-29 ENCOUNTER — Other Ambulatory Visit: Payer: Self-pay | Admitting: Internal Medicine

## 2018-08-20 ENCOUNTER — Other Ambulatory Visit: Payer: Self-pay | Admitting: Internal Medicine

## 2018-08-21 ENCOUNTER — Other Ambulatory Visit: Payer: Self-pay | Admitting: Internal Medicine

## 2018-09-22 ENCOUNTER — Other Ambulatory Visit: Payer: Self-pay | Admitting: Internal Medicine

## 2018-12-20 ENCOUNTER — Telehealth: Payer: Self-pay | Admitting: Internal Medicine

## 2018-12-21 NOTE — Telephone Encounter (Signed)
Please call patient and schedule appointment. Send back for refill after appointment is scheduled.

## 2018-12-21 NOTE — Telephone Encounter (Signed)
I left a detailed message on patient's voice mail to call back and schedule appointment. °

## 2018-12-30 NOTE — Telephone Encounter (Signed)
Thank you for the note. I advised Dr Silvio Pate.

## 2018-12-30 NOTE — Telephone Encounter (Signed)
[place] services. Pt has a doctor where she is living and is doing well. Give her regards to Dr Silvio Pate
